# Patient Record
Sex: Male | Born: 1950 | Race: White | Hispanic: No | Marital: Married | Smoking: Current every day smoker
Health system: Southern US, Community
[De-identification: ages and names within clinical notes are randomized; demographics above are authoritative.]

## PROBLEM LIST (undated history)

## (undated) DIAGNOSIS — J42 Unspecified chronic bronchitis: Secondary | ICD-10-CM

## (undated) DIAGNOSIS — M1651 Unilateral post-traumatic osteoarthritis, right hip: Secondary | ICD-10-CM

## (undated) DIAGNOSIS — K219 Gastro-esophageal reflux disease without esophagitis: Secondary | ICD-10-CM

## (undated) DIAGNOSIS — I2510 Atherosclerotic heart disease of native coronary artery without angina pectoris: Secondary | ICD-10-CM

## (undated) DIAGNOSIS — M1990 Unspecified osteoarthritis, unspecified site: Secondary | ICD-10-CM

## (undated) DIAGNOSIS — E118 Type 2 diabetes mellitus with unspecified complications: Secondary | ICD-10-CM

## (undated) DIAGNOSIS — I70219 Atherosclerosis of native arteries of extremities with intermittent claudication, unspecified extremity: Secondary | ICD-10-CM

## (undated) DIAGNOSIS — I10 Essential (primary) hypertension: Secondary | ICD-10-CM

## (undated) DIAGNOSIS — I70229 Atherosclerosis of native arteries of extremities with rest pain, unspecified extremity: Secondary | ICD-10-CM

## (undated) DIAGNOSIS — I700 Atherosclerosis of aorta: Secondary | ICD-10-CM

## (undated) HISTORY — PX: HERNIA REPAIR: SHX51

## (undated) HISTORY — PX: MUSCLE REPAIR: SHX867

## (undated) HISTORY — PX: FRACTURE SURGERY: SHX138

---

## 2007-09-19 ENCOUNTER — Ambulatory Visit: Payer: Self-pay | Admitting: General Surgery

## 2008-03-11 ENCOUNTER — Ambulatory Visit: Payer: Self-pay | Admitting: Surgery

## 2008-03-26 ENCOUNTER — Ambulatory Visit: Payer: Self-pay | Admitting: Surgery

## 2008-04-02 ENCOUNTER — Ambulatory Visit: Payer: Self-pay | Admitting: Surgery

## 2008-10-06 ENCOUNTER — Ambulatory Visit: Payer: Self-pay | Admitting: Urology

## 2017-08-14 DIAGNOSIS — I700 Atherosclerosis of aorta: Secondary | ICD-10-CM | POA: Insufficient documentation

## 2017-08-22 ENCOUNTER — Telehealth: Payer: Self-pay | Admitting: *Deleted

## 2017-08-22 DIAGNOSIS — Z87891 Personal history of nicotine dependence: Secondary | ICD-10-CM

## 2017-08-22 DIAGNOSIS — Z122 Encounter for screening for malignant neoplasm of respiratory organs: Secondary | ICD-10-CM

## 2017-08-22 NOTE — Telephone Encounter (Signed)
Received referral for initial lung cancer screening scan. Contacted patient and obtained smoking history,(current, 41 pack year) as well as answering questions related to screening process. Patient denies signs of lung cancer such as weight loss or hemoptysis. Patient denies comorbidity that would prevent curative treatment if lung cancer were found. Patient is scheduled for shared decision making visit and CT scan on 09/19/17.

## 2017-08-29 ENCOUNTER — Encounter
Admission: RE | Admit: 2017-08-29 | Discharge: 2017-08-29 | Disposition: A | Discharge status: Home / Self Care | Payer: Medicare HMO | Source: Ambulatory Visit | Attending: Orthopedic Surgery | Admitting: Orthopedic Surgery

## 2017-08-29 ENCOUNTER — Other Ambulatory Visit: Payer: Self-pay

## 2017-08-29 DIAGNOSIS — Z01810 Encounter for preprocedural cardiovascular examination: Secondary | ICD-10-CM | POA: Diagnosis present

## 2017-08-29 DIAGNOSIS — Z01812 Encounter for preprocedural laboratory examination: Secondary | ICD-10-CM | POA: Diagnosis present

## 2017-08-29 HISTORY — DX: Gastro-esophageal reflux disease without esophagitis: K21.9

## 2017-08-29 HISTORY — DX: Unspecified osteoarthritis, unspecified site: M19.90

## 2017-08-29 LAB — CBC
HEMATOCRIT: 47.1 % (ref 40.0–52.0)
HEMOGLOBIN: 15.4 g/dL (ref 13.0–18.0)
MCH: 31.2 pg (ref 26.0–34.0)
MCHC: 32.7 g/dL (ref 32.0–36.0)
MCV: 95.4 fL (ref 80.0–100.0)
Platelets: 226 10*3/uL (ref 150–440)
RBC: 4.93 MIL/uL (ref 4.40–5.90)
RDW: 15.1 % — AB (ref 11.5–14.5)
WBC: 5.3 10*3/uL (ref 3.8–10.6)

## 2017-08-29 LAB — BASIC METABOLIC PANEL
Anion gap: 9 (ref 5–15)
BUN: 20 mg/dL (ref 6–20)
CO2: 26 mmol/L (ref 22–32)
Calcium: 9.1 mg/dL (ref 8.9–10.3)
Chloride: 101 mmol/L (ref 101–111)
Creatinine, Ser: 1.03 mg/dL (ref 0.61–1.24)
GFR calc Af Amer: >60 mL/min (ref >60)
Glucose, Bld: 110 mg/dL — ABNORMAL HIGH (ref 65–99)
POTASSIUM: 4.5 mmol/L (ref 3.5–5.1)
SODIUM: 136 mmol/L (ref 135–145)

## 2017-08-29 LAB — URINALYSIS, ROUTINE W REFLEX MICROSCOPIC
BACTERIA UA: NONE SEEN
Bilirubin Urine: NEGATIVE
Glucose, UA: NEGATIVE mg/dL
HGB URINE DIPSTICK: NEGATIVE
Ketones, ur: NEGATIVE mg/dL
Leukocytes, UA: NEGATIVE
Nitrite: NEGATIVE
Protein, ur: 30 mg/dL — AB
SPECIFIC GRAVITY, URINE: 1.011 (ref 1.005–1.030)
Squamous Epithelial / LPF: NONE SEEN
pH: 5 (ref 5.0–8.0)

## 2017-08-29 LAB — SURGICAL PCR SCREEN
MRSA, PCR: NEGATIVE
STAPHYLOCOCCUS AUREUS: POSITIVE — AB

## 2017-08-29 LAB — TYPE AND SCREEN
ABO/RH(D): O NEG
ANTIBODY SCREEN: NEGATIVE

## 2017-08-29 LAB — PROTIME-INR
INR: 0.88
PROTHROMBIN TIME: 11.9 s (ref 11.4–15.2)

## 2017-08-29 LAB — APTT: aPTT: 33 seconds (ref 24–36)

## 2017-08-29 NOTE — Patient Instructions (Signed)
Your procedure is scheduled on: Tue 09/04/17 Report to DAY SURGERY DEPARTMENT LOCATED ON 2ND FLOOR MEDICAL MALL ENTRANCE. To find out your arrival time please call [phone] between 1PM - 3PM on Mon 09/03/17.  Remember: Instructions that are not followed completely may result in serious medical risk, up to and including death, or upon the discretion of your surgeon and anesthesiologist your surgery may need to be rescheduled.     _X__ 1. Do not eat food after midnight the night before your procedure.                 No gum chewing or hard candies. You may drink clear liquids up to 2 hours                 before you are scheduled to arrive for your surgery- DO not drink clear                 liquids within 2 hours of the start of your surgery.                 Clear Liquids include:  water, apple juice without pulp, clear carbohydrate                 drink such as Clearfast or Gatorade, Black Coffee or Tea (Do not add                 anything to coffee or tea).  __X__2.  On the morning of surgery brush your teeth with toothpaste and water, you                 may rinse your mouth with mouthwash if you wish.  Do not swallow any              toothpaste of mouthwash.     _X__ 3.  No Alcohol for 24 hours before or after surgery.   _X__ 4.  Do Not Smoke or use e-cigarettes For 24 Hours Prior to Your Surgery.                 Do not use any chewable tobacco products for at least 6 hours prior to                 surgery.  ____  5.  Bring all medications with you on the day of surgery if instructed.   __X__  6.  Notify your doctor if there is any change in your medical condition      (cold, fever, infections).     Do not wear jewelry, make-up, hairpins, clips or nail polish. Do not wear lotions, powders, or perfumes.  Do not shave 48 hours prior to surgery. Men may shave face and neck. Do not bring valuables to the hospital.    [facility] is not responsible for any belongings or  valuables.  Contacts, dentures/partials or body piercings may not be worn into surgery. Bring a case for your contacts, glasses or hearing aids, a denture cup will be supplied. Leave your suitcase in the car. After surgery it may be brought to your room. For patients admitted to the hospital, discharge time is determined by your treatment team.   Patients discharged the day of surgery will not be allowed to drive home.   Please read over the following fact sheets that you were given:   MRSA Information  __X__ Take these medicines the morning of surgery with A SIP OF WATER:  1. none  2.   3.   4.  5.  6.  ____ Fleet Enema (as directed)   __X__ Use CHG Soap/SAGE wipes as directed  ____ Use inhalers on the day of surgery  ____ Stop metformin/Janumet/Farxiga 2 days prior to surgery    ____ Take 1/2 of usual insulin dose the night before surgery. No insulin the morning          of surgery.   ____ Stop Blood Thinners Coumadin/Plavix/Xarelto/Pleta/Pradaxa/Eliquis/Effient/Aspirin  on   Or contact your Surgeon, Cardiologist or Medical Doctor regarding  ability to stop your blood thinners  __X__ Stop Anti-inflammatories 7 days before surgery such as Advil, Ibuprofen, Motrin,  BC or Goodies Powder, Naprosyn, Naproxen, Aleve, Aspirin YOU MAY TAKE TYLENOL   __X__ Stop all herbal supplements, fish oil or vitamin E until after surgery.    ____ Bring C-Pap to the hospital.

## 2017-08-30 LAB — URINE CULTURE: CULTURE: NO GROWTH

## 2017-08-31 ENCOUNTER — Ambulatory Visit
Admission: RE | Admit: 2017-08-31 | Discharge: 2017-08-31 | Disposition: A | Discharge status: Home / Self Care | Payer: Medicare HMO | Source: Ambulatory Visit | Attending: Orthopedic Surgery | Admitting: Orthopedic Surgery

## 2017-08-31 DIAGNOSIS — Z01812 Encounter for preprocedural laboratory examination: Secondary | ICD-10-CM | POA: Diagnosis not present

## 2017-08-31 LAB — SEDIMENTATION RATE: Sed Rate: 4 mm/hr (ref 0–20)

## 2017-09-03 MED ORDER — CEFAZOLIN SODIUM-DEXTROSE 2-4 GM/100ML-% IV SOLN
2.0000 g | Freq: Once | INTRAVENOUS | Status: AC
  Administered 2017-09-04: 2 g via INTRAVENOUS

## 2017-09-04 ENCOUNTER — Other Ambulatory Visit: Payer: Self-pay

## 2017-09-04 ENCOUNTER — Encounter: Payer: Self-pay | Admitting: Anesthesiology

## 2017-09-04 ENCOUNTER — Inpatient Hospital Stay: Payer: Medicare HMO

## 2017-09-04 ENCOUNTER — Inpatient Hospital Stay: Payer: Medicare HMO | Admitting: Anesthesiology

## 2017-09-04 ENCOUNTER — Inpatient Hospital Stay
Admission: RE | Admit: 2017-09-04 | Discharge: 2017-09-07 | DRG: 470 | Disposition: A | Discharge status: Home Health | Payer: Medicare HMO | Source: Ambulatory Visit | Attending: Orthopedic Surgery | Admitting: Orthopedic Surgery

## 2017-09-04 ENCOUNTER — Encounter
Admission: RE | Disposition: A | Discharge status: Home Health | Payer: Self-pay | Source: Ambulatory Visit | Attending: Orthopedic Surgery

## 2017-09-04 DIAGNOSIS — G8918 Other acute postprocedural pain: Secondary | ICD-10-CM

## 2017-09-04 DIAGNOSIS — M1992 Post-traumatic osteoarthritis, unspecified site: Principal | ICD-10-CM | POA: Diagnosis present

## 2017-09-04 DIAGNOSIS — M1651 Unilateral post-traumatic osteoarthritis, right hip: Secondary | ICD-10-CM | POA: Diagnosis present

## 2017-09-04 DIAGNOSIS — Z419 Encounter for procedure for purposes other than remedying health state, unspecified: Secondary | ICD-10-CM

## 2017-09-04 DIAGNOSIS — K219 Gastro-esophageal reflux disease without esophagitis: Secondary | ICD-10-CM | POA: Diagnosis present

## 2017-09-04 HISTORY — PX: TOTAL HIP ARTHROPLASTY: SHX124

## 2017-09-04 LAB — CBC
HCT: 41 % (ref 40.0–52.0)
Hemoglobin: 13.6 g/dL (ref 13.0–18.0)
MCH: 31.2 pg (ref 26.0–34.0)
MCHC: 33.1 g/dL (ref 32.0–36.0)
MCV: 94.1 fL (ref 80.0–100.0)
PLATELETS: 192 10*3/uL (ref 150–440)
RBC: 4.35 MIL/uL — AB (ref 4.40–5.90)
RDW: 14.6 % — AB (ref 11.5–14.5)
WBC: 11.7 10*3/uL — AB (ref 3.8–10.6)

## 2017-09-04 LAB — CREATININE, SERUM
CREATININE: 1.16 mg/dL (ref 0.61–1.24)
GFR calc non Af Amer: >60 mL/min (ref >60)

## 2017-09-04 LAB — ABO/RH: ABO/RH(D): O NEG

## 2017-09-04 SURGERY — ARTHROPLASTY, HIP, TOTAL, ANTERIOR APPROACH
Anesthesia: Spinal | Site: Hip | Laterality: Right | Wound class: Clean

## 2017-09-04 MED ORDER — BISACODYL 10 MG RE SUPP: 10.0000 mg | Freq: Every day | RECTAL | Status: DC | PRN

## 2017-09-04 MED ORDER — TRANEXAMIC ACID 1000 MG/10ML IV SOLN
INTRAVENOUS | Status: DC | PRN
  Administered 2017-09-04: 1000 mg via INTRAVENOUS

## 2017-09-04 MED ORDER — MIDAZOLAM HCL 5 MG/5ML IJ SOLN
INTRAMUSCULAR | Status: DC | PRN
  Administered 2017-09-04: 2 mg via INTRAVENOUS

## 2017-09-04 MED ORDER — BUPIVACAINE-EPINEPHRINE (PF) 0.25% -1:200000 IJ SOLN
INTRAMUSCULAR | Status: AC
  Filled 2017-09-04: qty 30

## 2017-09-04 MED ORDER — BUPIVACAINE HCL (PF) 0.5 % IJ SOLN
INTRAMUSCULAR | Status: AC
  Filled 2017-09-04: qty 10

## 2017-09-04 MED ORDER — HYDROMORPHONE HCL 1 MG/ML IJ SOLN: 0.5000 mg | INTRAMUSCULAR | Status: DC | PRN

## 2017-09-04 MED ORDER — ZOLPIDEM TARTRATE 5 MG PO TABS
5.0000 mg | ORAL_TABLET | Freq: Every evening | ORAL | Status: DC | PRN
  Administered 2017-09-06: 5 mg via ORAL
  Filled 2017-09-04 (×2): qty 1

## 2017-09-04 MED ORDER — TRAMADOL HCL 50 MG PO TABS
50.0000 mg | ORAL_TABLET | Freq: Four times a day (QID) | ORAL | Status: DC
  Administered 2017-09-04 – 2017-09-07 (×10): 50 mg via ORAL
  Filled 2017-09-04 (×10): qty 1

## 2017-09-04 MED ORDER — ALUM & MAG HYDROXIDE-SIMETH 200-200-20 MG/5ML PO SUSP
30.0000 mL | ORAL | Status: DC | PRN
  Administered 2017-09-06: 30 mL via ORAL
  Filled 2017-09-04: qty 30

## 2017-09-04 MED ORDER — NEOMYCIN-POLYMYXIN B GU 40-200000 IR SOLN
Status: AC
  Filled 2017-09-04: qty 4

## 2017-09-04 MED ORDER — ONDANSETRON HCL 4 MG PO TABS: 4.0000 mg | ORAL_TABLET | Freq: Four times a day (QID) | ORAL | Status: DC | PRN

## 2017-09-04 MED ORDER — OXYCODONE HCL 5 MG PO TABS
10.0000 mg | ORAL_TABLET | ORAL | Status: DC | PRN
  Administered 2017-09-05 – 2017-09-06 (×3): 15 mg via ORAL
  Administered 2017-09-06: 10 mg via ORAL
  Filled 2017-09-04 (×4): qty 3

## 2017-09-04 MED ORDER — SODIUM CHLORIDE 0.9 % IV SOLN
INTRAVENOUS | Status: DC
  Administered 2017-09-04 – 2017-09-05 (×2): via INTRAVENOUS

## 2017-09-04 MED ORDER — TRANEXAMIC ACID 1000 MG/10ML IV SOLN
INTRAVENOUS | Status: AC
  Filled 2017-09-04: qty 10

## 2017-09-04 MED ORDER — DOCUSATE SODIUM 100 MG PO CAPS
100.0000 mg | ORAL_CAPSULE | Freq: Two times a day (BID) | ORAL | Status: DC
  Administered 2017-09-04 – 2017-09-07 (×6): 100 mg via ORAL
  Filled 2017-09-04 (×6): qty 1

## 2017-09-04 MED ORDER — PROPOFOL 500 MG/50ML IV EMUL
INTRAVENOUS | Status: DC | PRN
  Administered 2017-09-04: 75 ug/kg/min via INTRAVENOUS

## 2017-09-04 MED ORDER — ACETAMINOPHEN 10 MG/ML IV SOLN
INTRAVENOUS | Status: DC | PRN
  Administered 2017-09-04: 1000 mg via INTRAVENOUS

## 2017-09-04 MED ORDER — LACTATED RINGERS IV SOLN
INTRAVENOUS | Status: DC
  Administered 2017-09-04: 15:00:00 via INTRAVENOUS

## 2017-09-04 MED ORDER — PROPOFOL 10 MG/ML IV BOLUS
INTRAVENOUS | Status: DC | PRN
  Administered 2017-09-04: 40 mg via INTRAVENOUS

## 2017-09-04 MED ORDER — ACETAMINOPHEN 10 MG/ML IV SOLN
INTRAVENOUS | Status: AC
  Filled 2017-09-04: qty 100

## 2017-09-04 MED ORDER — PHENOL 1.4 % MT LIQD
1.0000 | OROMUCOSAL | Status: DC | PRN
  Filled 2017-09-04: qty 177

## 2017-09-04 MED ORDER — METHOCARBAMOL 500 MG PO TABS
500.0000 mg | ORAL_TABLET | Freq: Four times a day (QID) | ORAL | Status: DC | PRN
  Administered 2017-09-05: 500 mg via ORAL
  Filled 2017-09-04: qty 1

## 2017-09-04 MED ORDER — MIDAZOLAM HCL 2 MG/2ML IJ SOLN
INTRAMUSCULAR | Status: AC
  Filled 2017-09-04: qty 2

## 2017-09-04 MED ORDER — CEFAZOLIN SODIUM-DEXTROSE 2-4 GM/100ML-% IV SOLN
INTRAVENOUS | Status: AC
  Filled 2017-09-04: qty 100

## 2017-09-04 MED ORDER — ONDANSETRON HCL 4 MG/2ML IJ SOLN: 4.0000 mg | Freq: Once | INTRAMUSCULAR | Status: DC | PRN

## 2017-09-04 MED ORDER — ONDANSETRON HCL 4 MG/2ML IJ SOLN
INTRAMUSCULAR | Status: DC | PRN
  Administered 2017-09-04: 4 mg via INTRAVENOUS

## 2017-09-04 MED ORDER — PROPOFOL 500 MG/50ML IV EMUL
INTRAVENOUS | Status: AC
  Filled 2017-09-04: qty 50

## 2017-09-04 MED ORDER — BUPIVACAINE-EPINEPHRINE 0.25% -1:200000 IJ SOLN
INTRAMUSCULAR | Status: DC | PRN
  Administered 2017-09-04: 30 mL

## 2017-09-04 MED ORDER — NEOMYCIN-POLYMYXIN B GU 40-200000 IR SOLN
Status: DC | PRN
  Administered 2017-09-04: 4 mL

## 2017-09-04 MED ORDER — ENOXAPARIN SODIUM 40 MG/0.4ML ~~LOC~~ SOLN
40.0000 mg | SUBCUTANEOUS | Status: DC
  Administered 2017-09-05 – 2017-09-07 (×3): 40 mg via SUBCUTANEOUS
  Filled 2017-09-04 (×3): qty 0.4

## 2017-09-04 MED ORDER — METHOCARBAMOL 1000 MG/10ML IJ SOLN
500.0000 mg | Freq: Four times a day (QID) | INTRAVENOUS | Status: DC | PRN
  Filled 2017-09-04: qty 5

## 2017-09-04 MED ORDER — METOCLOPRAMIDE HCL 10 MG PO TABS: 5.0000 mg | ORAL_TABLET | Freq: Three times a day (TID) | ORAL | Status: DC | PRN

## 2017-09-04 MED ORDER — SODIUM CHLORIDE 0.9 % IV SOLN
INTRAVENOUS | Status: DC | PRN
  Administered 2017-09-04: 50 ug/min via INTRAVENOUS

## 2017-09-04 MED ORDER — ONDANSETRON HCL 4 MG/2ML IJ SOLN: 4.0000 mg | Freq: Four times a day (QID) | INTRAMUSCULAR | Status: DC | PRN

## 2017-09-04 MED ORDER — MAGNESIUM CITRATE PO SOLN
1.0000 | Freq: Once | ORAL | Status: AC | PRN
  Administered 2017-09-06: 1 via ORAL
  Filled 2017-09-04 (×4): qty 296

## 2017-09-04 MED ORDER — GABAPENTIN 300 MG PO CAPS
300.0000 mg | ORAL_CAPSULE | Freq: Three times a day (TID) | ORAL | Status: DC
  Administered 2017-09-04 – 2017-09-07 (×8): 300 mg via ORAL
  Filled 2017-09-04 (×8): qty 1

## 2017-09-04 MED ORDER — METOCLOPRAMIDE HCL 5 MG/ML IJ SOLN: 5.0000 mg | Freq: Three times a day (TID) | INTRAMUSCULAR | Status: DC | PRN

## 2017-09-04 MED ORDER — ACETAMINOPHEN 500 MG PO TABS
1000.0000 mg | ORAL_TABLET | Freq: Four times a day (QID) | ORAL | Status: AC
  Administered 2017-09-04 – 2017-09-05 (×3): 1000 mg via ORAL
  Filled 2017-09-04 (×3): qty 2

## 2017-09-04 MED ORDER — FENTANYL CITRATE (PF) 100 MCG/2ML IJ SOLN: 25.0000 ug | INTRAMUSCULAR | Status: DC | PRN

## 2017-09-04 MED ORDER — FAMOTIDINE 20 MG PO TABS
20.0000 mg | ORAL_TABLET | Freq: Once | ORAL | Status: AC
  Administered 2017-09-04: 20 mg via ORAL

## 2017-09-04 MED ORDER — MENTHOL 3 MG MT LOZG
1.0000 | LOZENGE | OROMUCOSAL | Status: DC | PRN
  Filled 2017-09-04: qty 9

## 2017-09-04 MED ORDER — ACETAMINOPHEN 325 MG PO TABS
325.0000 mg | ORAL_TABLET | Freq: Four times a day (QID) | ORAL | Status: DC | PRN
  Administered 2017-09-06: 650 mg via ORAL
  Filled 2017-09-04: qty 2

## 2017-09-04 MED ORDER — CEFAZOLIN SODIUM-DEXTROSE 2-4 GM/100ML-% IV SOLN
2.0000 g | Freq: Four times a day (QID) | INTRAVENOUS | Status: AC
  Administered 2017-09-04 – 2017-09-05 (×3): 2 g via INTRAVENOUS
  Filled 2017-09-04 (×3): qty 100

## 2017-09-04 MED ORDER — DIPHENHYDRAMINE HCL 12.5 MG/5ML PO ELIX: 12.5000 mg | ORAL_SOLUTION | ORAL | Status: DC | PRN

## 2017-09-04 MED ORDER — OXYCODONE HCL 5 MG PO TABS
5.0000 mg | ORAL_TABLET | ORAL | Status: DC | PRN
  Administered 2017-09-05: 10 mg via ORAL
  Administered 2017-09-05: 5 mg via ORAL
  Administered 2017-09-05 – 2017-09-07 (×6): 10 mg via ORAL
  Filled 2017-09-04 (×3): qty 2
  Filled 2017-09-04: qty 1
  Filled 2017-09-04 (×5): qty 2

## 2017-09-04 MED ORDER — FAMOTIDINE 20 MG PO TABS
ORAL_TABLET | ORAL | Status: AC
  Filled 2017-09-04: qty 1

## 2017-09-04 MED ORDER — SENNOSIDES-DOCUSATE SODIUM 8.6-50 MG PO TABS: 1.0000 | ORAL_TABLET | Freq: Every evening | ORAL | Status: DC | PRN

## 2017-09-04 MED ORDER — BUPIVACAINE HCL (PF) 0.5 % IJ SOLN
INTRAMUSCULAR | Status: DC | PRN
  Administered 2017-09-04: 3 mL

## 2017-09-04 SURGICAL SUPPLY — 57 items
BLADE SAW SAG 18.5X105 (BLADE) ×3 IMPLANT
BNDG COHESIVE 6X5 TAN STRL LF (GAUZE/BANDAGES/DRESSINGS) ×9 IMPLANT
CANISTER SUCT 1200ML W/VALVE (MISCELLANEOUS) ×3 IMPLANT
CHLORAPREP W/TINT 26ML (MISCELLANEOUS) ×3 IMPLANT
DRAPE C-ARM XRAY 36X54 (DRAPES) ×3 IMPLANT
DRAPE INCISE IOBAN 66X60 STRL (DRAPES) IMPLANT
DRAPE POUCH INSTRU U-SHP 10X18 (DRAPES) ×3 IMPLANT
DRAPE SHEET LG 3/4 BI-LAMINATE (DRAPES) ×9 IMPLANT
DRAPE TABLE BACK 80X90 (DRAPES) ×3 IMPLANT
DRESSING SURGICEL FIBRLLR 1X2 (HEMOSTASIS) ×2 IMPLANT
DRSG OPSITE POSTOP 4X10 (GAUZE/BANDAGES/DRESSINGS) ×3 IMPLANT
DRSG OPSITE POSTOP 4X8 (GAUZE/BANDAGES/DRESSINGS) ×6 IMPLANT
DRSG SURGICEL FIBRILLAR 1X2 (HEMOSTASIS) ×6
ELECT BLADE 6.5 EXT (BLADE) ×3 IMPLANT
ELECT REM PT RETURN 9FT ADLT (ELECTROSURGICAL) ×3
ELECTRODE REM PT RTRN 9FT ADLT (ELECTROSURGICAL) ×1 IMPLANT
EVACUATOR 1/8 PVC DRAIN (DRAIN) IMPLANT
GLOVE BIOGEL PI IND STRL 9 (GLOVE) ×1 IMPLANT
GLOVE BIOGEL PI INDICATOR 9 (GLOVE) ×2
GLOVE SURG SYN 9.0  PF PI (GLOVE) ×4
GLOVE SURG SYN 9.0 PF PI (GLOVE) ×2 IMPLANT
GOWN SRG 2XL LVL 4 RGLN SLV (GOWNS) ×1 IMPLANT
GOWN STRL NON-REIN 2XL LVL4 (GOWNS) ×2
GOWN STRL REUS W/ TWL LRG LVL3 (GOWN DISPOSABLE) ×1 IMPLANT
GOWN STRL REUS W/TWL LRG LVL3 (GOWN DISPOSABLE) ×2
HIP FEM HD XL 28 (Head) ×3 IMPLANT
HOLDER FOLEY CATH W/STRAP (MISCELLANEOUS) ×3 IMPLANT
HOOD PEEL AWAY FLYTE STAYCOOL (MISCELLANEOUS) ×3 IMPLANT
KIT PREVENA INCISION MGT 13 (CANNISTER) ×3 IMPLANT
LINER DM 28MM (Liner) ×3 IMPLANT
LINER DM SZH 28X56 (Liner) ×1 IMPLANT
MAT BLUE FLOOR 46X72 FLO (MISCELLANEOUS) ×3 IMPLANT
NDL SAFETY ECLIPSE 18X1.5 (NEEDLE) ×1 IMPLANT
NEEDLE HYPO 18GX1.5 SHARP (NEEDLE) ×2
NEEDLE SPNL 18GX3.5 QUINCKE PK (NEEDLE) ×3 IMPLANT
NS IRRIG 1000ML POUR BTL (IV SOLUTION) ×3 IMPLANT
PACK HIP COMPR (MISCELLANEOUS) ×3 IMPLANT
SHELL ACETABULAR DM  56MM (Shell) ×3 IMPLANT
SOL PREP PVP 2OZ (MISCELLANEOUS) ×3
SOLUTION PREP PVP 2OZ (MISCELLANEOUS) ×1 IMPLANT
SPONGE DRAIN TRACH 4X4 STRL 2S (GAUZE/BANDAGES/DRESSINGS) ×3 IMPLANT
STAPLER SKIN PROX 35W (STAPLE) ×3 IMPLANT
STEM FEMORAL 4 STD COLLARED (Stem) ×3 IMPLANT
STRAP SAFETY 5IN WIDE (MISCELLANEOUS) ×3 IMPLANT
SUT DVC 2 QUILL PDO  T11 36X36 (SUTURE) ×2
SUT DVC 2 QUILL PDO T11 36X36 (SUTURE) ×1 IMPLANT
SUT SILK 0 (SUTURE) ×2
SUT SILK 0 30XBRD TIE 6 (SUTURE) ×1 IMPLANT
SUT V-LOC 90 ABS DVC 3-0 CL (SUTURE) ×3 IMPLANT
SUT VIC AB 1 CT1 36 (SUTURE) ×3 IMPLANT
SYR 20CC LL (SYRINGE) ×3 IMPLANT
SYR 30ML LL (SYRINGE) ×3 IMPLANT
SYR BULB IRRIG 60ML STRL (SYRINGE) ×3 IMPLANT
TAPE MICROFOAM 4IN (TAPE) ×3 IMPLANT
TOWEL OR 17X26 4PK STRL BLUE (TOWEL DISPOSABLE) ×3 IMPLANT
TRAY FOLEY W/METER SILVER 16FR (SET/KITS/TRAYS/PACK) ×3 IMPLANT
WND VAC CANISTER 500ML (MISCELLANEOUS) IMPLANT

## 2017-09-04 NOTE — Anesthesia Post-op Follow-up Note (Signed)
Anesthesia QCDR form completed.        

## 2017-09-04 NOTE — Anesthesia Procedure Notes (Signed)
Spinal  Patient location during procedure: OR Start time: 09/04/2017 4:55 PM End time: 09/04/2017 4:58 PM Staffing Other anesthesia staff: Laruth BouchardGolob, Jamie C, RN Performed: other anesthesia staff  Preanesthetic Checklist Completed: patient identified, site marked, surgical consent, pre-op evaluation, timeout performed, IV checked, risks and benefits discussed and monitors and equipment checked Spinal Block Patient position: sitting Prep: ChloraPrep Patient monitoring: heart rate Approach: midline Location: L3-4 Injection technique: single-shot Needle Needle type: Pencan  Needle length: 10 cm Assessment Sensory level: T4

## 2017-09-04 NOTE — Anesthesia Preprocedure Evaluation (Addendum)
Anesthesia Evaluation  Patient identified by MRN, date of birth, ID band Patient awake    Reviewed: Allergy & Precautions, NPO status , Patient's Chart, lab work & pertinent test results, reviewed documented beta blocker date and time   Airway Mallampati: III  TM Distance: >3 FB     Dental  (+) Chipped, Upper Dentures, Lower Dentures   Pulmonary Current Smoker,           Cardiovascular      Neuro/Psych    GI/Hepatic GERD  Controlled,  Endo/Other    Renal/GU      Musculoskeletal  (+) Arthritis ,   Abdominal   Peds  Hematology   Anesthesia Other Findings Obese. Rbbb.  Reproductive/Obstetrics                            Anesthesia Physical Anesthesia Plan  ASA: III  Anesthesia Plan: Spinal   Post-op Pain Management:    Induction:   PONV Risk Score and Plan:   Airway Management Planned: Nasal Cannula  Additional Equipment:   Intra-op Plan:   Post-operative Plan:   Informed Consent: I have reviewed the patients History and Physical, chart, labs and discussed the procedure including the risks, benefits and alternatives for the proposed anesthesia with the patient or authorized representative who has indicated his/her understanding and acceptance.     Plan Discussed with: CRNA  Anesthesia Plan Comments:        Anesthesia Quick Evaluation

## 2017-09-04 NOTE — Op Note (Signed)
09/04/2017  6:27 PM  PATIENT:  Phillip CulverKeith Mendez  67 y.o. male  PRE-OPERATIVE DIAGNOSIS:  post traumatic osteoarthritis of right hip  POST-OPERATIVE DIAGNOSIS:  post traumatic osteoarthritis of right hip  PROCEDURE:  Procedure(s): TOTAL HIP ARTHROPLASTY ANTERIOR APPROACH (Right)  SURGEON: Leitha SchullerMichael J Akiba Melfi, MD  ASSISTANTS: none  ANESTHESIA:   spinal  EBL:  Total I/O In: 600 [I.V.:600] Out: 650 [Urine:400; Blood:250]  BLOOD ADMINISTERED:none  DRAINS: none   LOCAL MEDICATIONS USED:  MARCAINE     SPECIMEN:  Source of Specimen:  Right femoral head  DISPOSITION OF SPECIMEN:  PATHOLOGY  COUNTS:  YES  TOURNIQUET:  * No tourniquets in log *  IMPLANTS: Medacta 4 standard AMIS, 56 mm Mpact DM cup and liner, XL 28 mm metal head  DICTATION: .Dragon Dictation   The patient was brought to the operating room and after spinal anesthesia was obtained patient was placed on the operative table with the ipsilateral foot into the Medacta attachment, contralateral leg on a well-padded table. C-arm was brought in and preop template x-ray taken. After prepping and draping in usual sterile fashion appropriate patient identification and timeout procedures were completed. Anterior approach to the hip was obtained and centered over the greater trochanter and TFL muscle. The subcutaneous tissue was incised hemostasis being achieved by electrocautery. TFL fascia was incised and the muscle retracted laterally deep retractor placed. The lateral femoral circumflex vessels were identified and ligated. The anterior capsule was exposed and a capsulotomy performed. The neck was identified and a femoral neck cut carried out with a saw. The head was removed without difficulty and showed sclerotic femoral head and acetabulum. Reaming was carried out to 54 mm and a 56 mm cup trial gave appropriate tightness to the acetabular component a 56 DM cup was impacted into position. The leg was then externally rotated and  ischiofemoral and pubofemoral releases carried out. The femur was sequentially broached to a size 4, size 4 standard with XL head trials were placed and the final components chosen. The 4 standard stem was inserted along with a metal XL 28 mm head and 56 mm liner. The hip was reduced and was stable the wound was thoroughly irrigated with fibrillar placed along the posterior capsule and medial neck. The deep fascia was closed using a heavy Quill after infiltration of 30 cc of quarter percent Sensorcaine with epinephrine.  Skin closed with 3-0 v-loc subcuticular and skin staples followed by Xeroform and honeycomb dressing  PLAN OF CARE: Admit to inpatient

## 2017-09-04 NOTE — Transfer of Care (Signed)
Immediate Anesthesia Transfer of Care Note  Patient: Phillip Mendez  Procedure(s) Performed: TOTAL HIP ARTHROPLASTY ANTERIOR APPROACH (Right Hip)  Patient Location: PACU  Anesthesia Type:Spinal  Level of Consciousness: awake and drowsy  Airway & Oxygen Therapy: Patient Spontanous Breathing and Patient connected to face mask oxygen  Post-op Assessment: Report given to RN and Post -op Vital signs reviewed and stable  Post vital signs: Reviewed and stable  Last Vitals:  Vitals Value Taken Time  BP 101/58 09/04/2017  6:27 PM  Temp 36.8 C 09/04/2017  6:27 PM  Pulse 58 09/04/2017  6:27 PM  Resp 16 09/04/2017  6:27 PM  SpO2 100 % 09/04/2017  6:27 PM  Vitals shown include unvalidated device data.  Last Pain:  Vitals:   09/04/17 1827  TempSrc: Tympanic  PainSc:          Complications: No apparent anesthesia complications

## 2017-09-04 NOTE — H&P (Signed)
Reviewed paper H+P, will be scanned into chart. No changes noted.  

## 2017-09-05 ENCOUNTER — Encounter: Payer: Self-pay | Admitting: Orthopedic Surgery

## 2017-09-05 NOTE — Care Management Note (Signed)
Case Management Note  Patient Details  Name: Phillip Mendez MRN: [id number] Date of Birth: 07/05/1950  Subjective/Objective:  POD # 1 right total hip arthroplasty.  Met with patient to discuss discharge planning. He lives at home with his wife and daughter. He will need a walker, declined bsc. Ordered from [place] with [facility]. Offered choice of home health agencies. Patient prefers [facility]. Referral to [facility] with [facility] for HHPT. [facility]acy: [facility] [address] [phone]. Called Lovenox 40 mg # 14 no refills.                  Action/Plan: [facility] for HHPT and walker. Lovenox called in.   Expected Discharge Date:  09/06/17               Expected Discharge Plan:  [place]  In-House Referral:     Discharge planning Services  CM Consult  Post Acute Care Choice:  Durable Medical Equipment, Home Health Choice offered to:  Patient  DME Arranged:  Walker rolling DME Agency:  [place]:  PT [facility] Agency:  [place]  Status of Service:  In process, will continue to follow  If discussed at Long Length of Stay Meetings, dates discussed:    Additional Comments:  Jolly Mango, RN 09/05/2017, 11:05 AM

## 2017-09-05 NOTE — Progress Notes (Signed)
Clinical Social Worker (CSW) received SNF consult. PT is recommending home health. RN case manager aware of above. Please reconsult if future social work needs arise. CSW signing off.   Adylee Leonardo, LCSW [phone] 

## 2017-09-05 NOTE — Evaluation (Signed)
Physical Therapy Evaluation Patient Details Name: Phillip Mendez MRN: [id number] DOB: Sep 18, 1950 Today's Date: 09/05/2017   History of Present Illness  Pt admitted from home s/p R THA anterior approach.  PMH includes GERD and arthritis.  Clinical Impression  Patient is a 67 year old male who lives in a one story home with his wife.  He is very active at baseline and uses a SPC PRN when he has "worked too much".  Pt is alert and oriented to self, time and place and does demonstrate some impulsivity with movement when receiving instructions.  Pt presents with WNL strength of bilateral UE and L LE, as well as R LE not affected by procedure.  He reports 3/10 pain throughout majority of evaluation.  Pt required min A for moving R LE over EOB when performing supine to sit.  Pt able to balance at EOB for 3-4 min without assist.  Pt able to perform STS CGA with VC's for safe management of RW and education concerning wt shift and use of RW for management of WB.  He was able to ambulate 50 ft in room CGA with VC's for sequencing during turns and during stand to sit.  PT introduced LE exercises and advised pt concerning frequency and importance of there ex.  Pt was agreeable.  Pt will continue to benefit from skilled PT with focus on strength, ROM, tolerance to activity, functional mobility, pain management and safe use of AD.    Follow Up Recommendations Home health PT    Equipment Recommendations  Rolling walker with 5" wheels    Recommendations for Other Services       Precautions / Restrictions Precautions Precautions: Anterior Hip Restrictions Weight Bearing Restrictions: Yes RLE Weight Bearing: Weight bearing as tolerated      Mobility  Bed Mobility Overal bed mobility: Needs Assistance Bed Mobility: Supine to Sit     Supine to sit: Min assist     General bed mobility comments: PT provided assistance in bringing R LE over EOB and VC's for sequencing for best body  mechanics.  Transfers Overall transfer level: Needs assistance Equipment used: Rolling walker (2 wheeled) Transfers: Sit to/from Stand Sit to Stand: Min guard         General transfer comment: PT provided education concerning safe use of RW which needs to be reinforced.  Pt was able to stand without physical assist.  Ambulation/Gait Ambulation/Gait assistance: Min guard Ambulation Distance (Feet): 50 Feet Assistive device: Rolling walker (2 wheeled)     Gait velocity interpretation: Below normal speed for age/gender General Gait Details: Pt able to ambulate in to bathroom with RW and VC's from PT for sequencing during turns and during stand to sit.  Pt presented with L wt shift and decreased stance time on R LE, slight out turn of R LE and moderate foot clearance bilaterally during swing phase.  Stairs            Wheelchair Mobility    Modified Rankin (Stroke Patients Only)       Balance Overall balance assessment: Modified Independent                                           Pertinent Vitals/Pain Pain Assessment: 0-10 Pain Score: 3  Pain Location: R hip with WB    Home Living Family/patient expects to be discharged to:: Private residence Living Arrangements: Spouse/significant  other Available Help at Discharge: Family Type of Home: House Home Access: Stairs to enter Entrance Stairs-Rails: Can reach both Entrance Stairs-Number of Steps: 2 Home Layout: One level Home Equipment: Cane - single point      Prior Function Level of Independence: Independent with assistive device(s)         Comments: Uses SPC on occasion when he "works too much" as he sits down and rests.     Hand Dominance   Dominant Hand: Right    Extremity/Trunk Assessment   Upper Extremity Assessment Upper Extremity Assessment: Overall WFL for tasks assessed    Lower Extremity Assessment Lower Extremity Assessment: Overall WFL for tasks assessed;RLE  deficits/detail RLE: Unable to fully assess due to pain RLE Sensation: WNL RLE Coordination: WNL    Cervical / Trunk Assessment Cervical / Trunk Assessment: Normal  Communication   Communication: No difficulties  Cognition Arousal/Alertness: Awake/alert Behavior During Therapy: WFL for tasks assessed/performed Overall Cognitive Status: Within Functional Limits for tasks assessed                                        General Comments      Exercises Total Joint Exercises Ankle Circles/Pumps: AROM;20 reps;Seated Quad Sets: Strengthening;Right;10 reps;Seated Short Arc Quad: Right;5 reps;Strengthening;Seated Hip ABduction/ADduction: Strengthening;5 reps;Seated   Assessment/Plan    PT Assessment Patient needs continued PT services  PT Problem List Decreased strength;Decreased mobility;Decreased range of motion;Decreased activity tolerance;Decreased balance;Decreased knowledge of use of DME;Pain;Decreased knowledge of precautions       PT Treatment Interventions DME instruction;Therapeutic activities;Gait training;Therapeutic exercise;Stair training;Balance training;Functional mobility training;Neuromuscular re-education;Patient/family education    PT Goals (Current goals can be found in the Care Plan section)  Acute Rehab PT Goals Patient Stated Goal: To return home and back to active lifestyle as soon as possible. PT Goal Formulation: With patient Time For Goal Achievement: 09/19/17 Potential to Achieve Goals: Good    Frequency BID   Barriers to discharge        Co-evaluation               AM-PAC PT "6 Clicks" Daily Activity  Outcome Measure Difficulty turning over in bed (including adjusting bedclothes, sheets and blankets)?: A Little Difficulty moving from lying on back to sitting on the side of the bed? : A Little Difficulty sitting down on and standing up from a chair with arms (e.g., wheelchair, bedside commode, etc,.)?: A Little Help  needed moving to and from a bed to chair (including a wheelchair)?: A Little Help needed walking in hospital room?: A Little Help needed climbing 3-5 steps with a railing? : A Little 6 Click Score: 18    End of Session Equipment Utilized During Treatment: Gait belt Activity Tolerance: Patient tolerated treatment well Patient left: in chair;with chair alarm set;with call bell/phone within reach Nurse Communication: Mobility status PT Visit Diagnosis: Unsteadiness on feet (R26.81);Muscle weakness (generalized) (M62.81);Pain Pain - Right/Left: Right Pain - part of body: Hip    Time: 0930-1000 PT Time Calculation (min) (ACUTE ONLY): 30 min   Charges:   PT Evaluation $PT Eval Low Complexity: 1 Low PT Treatments $Therapeutic Activity: 8-22 mins   PT G Codes:   PT G-Codes **NOT FOR INPATIENT CLASS** Functional Assessment Tool Used: AM-PAC 6 Clicks Basic Mobility    Glenetta Hew, PT, DPT   Glenetta Hew 09/05/2017, 10:10 AM

## 2017-09-05 NOTE — Evaluation (Signed)
Occupational Therapy Evaluation Patient Details Name: Phillip Mendez MRN: [id number] DOB: 03-06-1951 Today's Date: 09/05/2017    History of Present Illness Pt is a  67 y.o. male who [facility] approach Right THA. Pt. PMH includes GERD and arthritis.   Clinical Impression   Pt. Presents with weakness, and limited functional mobility which limits the ability to complete basic ADL and IADL functioning. Pt. Resides at home with his wife, and daughter. Pt. was independent with ADLs, and IADL functioning: including: meal preparation, and medication management. 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 Pt. education was provided about A/E use for LE ADLs. Pt. reports having a reacher, but would like a LH shoehorn. Pt. walked to the bathroom toilet with CGA, stood to perform toileting with modI, and supervision for standing grooming tasks at the sinkside.Pt. could benefit from OT services for ADL training, A/E training, and pt. education about home modification, and DME. Pt. plans to return home with family upon discharge. No further OT services are warranted at this time.    Follow Up Recommendations  No OT follow up    Equipment Recommendations       Recommendations for Other Services       Precautions / Restrictions Precautions Precautions: Anterior Hip Restrictions Weight Bearing Restrictions: Yes RLE Weight Bearing: Weight bearing as tolerated      Mobility Bed Mobility Overal bed mobility: Needs Assistance Bed Mobility: Supine to Sit     Supine to sit: Min assist     General bed mobility comments: PT provided assistance in bringing R LE over EOB and VC's for sequencing for best body mechanics.  Transfers Overall transfer level: Needs assistance Equipment used: Rolling walker (2 wheeled) Transfers: Sit to/from Stand Sit to Stand: Min guard         General transfer comment: PT provided education concerning safe use of RW which  needs to be reinforced.  Pt was able to stand without physical assist.    Balance Overall balance assessment: Modified Independent                                         ADL either performed or assessed with clinical judgement   ADL Overall ADL's : Needs assistance/impaired Eating/Feeding: Independent;Set up   Grooming: Standing;Set up;Supervision/safety               Lower Body Dressing: Set up;Minimal assistance   Toilet Transfer: Min guard;Ambulation;Regular Social workerToilet   Toileting- Clothing Manipulation and Hygiene: Independent       Functional mobility during ADLs: Modified independent       Vision         Perception     Praxis      Pertinent Vitals/Pain Pain Assessment: 0-10 Pain Score: 3  Pain Location: Right Hip Pain Descriptors / Indicators: Aching Pain Intervention(s): Limited activity within patient's tolerance;Monitored during session;Repositioned;Patient requesting pain meds-RN notified     Hand Dominance Right   Extremity/Trunk Assessment Upper Extremity Assessment Upper Extremity Assessment: Overall WFL for tasks assessed       Communication Communication Communication: No difficulties   Cognition Arousal/Alertness: Awake/alert Behavior During Therapy: WFL for tasks assessed/performed Overall Cognitive Status: Within Functional Limits for tasks assessed  General Comments       Exercises   Shoulder Instructions      Home Living Family/patient expects to be discharged to:: Private residence Living Arrangements: Spouse/significant other Available Help at Discharge: Family Type of Home: House Home Access: Stairs to enter Secretary/administrator of Steps: 2 Entrance Stairs-Rails: Can reach both Home Layout: One level     Bathroom Shower/Tub: Producer, television/film/video: Handicapped height Bathroom Accessibility: Yes   Home Equipment: Cane - single point           Prior Functioning/Environment Level of Independence: Independent with assistive device(s)        Comments: Independent with ADLs, IADLS, and working in his own business.        OT Problem List: Decreased strength;Decreased activity tolerance;Decreased knowledge of use of DME or AE;Decreased range of motion;Pain      OT Treatment/Interventions:      OT Goals(Current goals can be found in the care plan section) Acute Rehab OT Goals Patient Stated Goal: To return home OT Goal Formulation: With patient Potential to Achieve Goals: Good  OT Frequency:     Barriers to D/C:            Co-evaluation              AM-PAC PT "6 Clicks" Daily Activity     Outcome Measure Help from another person eating meals?: None Help from another person taking care of personal grooming?: A Little Help from another person toileting, which includes using toliet, bedpan, or urinal?: A Little Help from another person bathing (including washing, rinsing, drying)?: A Little Help from another person to put on and taking off regular upper body clothing?: None Help from another person to put on and taking off regular lower body clothing?: A Little 6 Click Score: 20   End of Session Equipment Utilized During Treatment: Gait belt  Activity Tolerance: Patient tolerated treatment well Patient left:                     Time: 1025-1050 OT Time Calculation (min): 25 min Charges:  OT General Charges $OT Visit: 1 Visit OT Evaluation $OT Eval Moderate Complexity: 1 Mod G-Codes:     Olegario Messier, MS, OTR/L   Olegario Messier, MS, OTR/L 09/05/2017, 1:23 PM

## 2017-09-05 NOTE — Anesthesia Postprocedure Evaluation (Signed)
Anesthesia Post Note  Patient: Phillip Mendez  Procedure(s) Performed: TOTAL HIP ARTHROPLASTY ANTERIOR APPROACH (Right Hip)  Patient location during evaluation: Nursing Unit Anesthesia Type: Spinal Level of consciousness: awake, awake and alert, oriented and patient cooperative Pain management: pain level controlled Respiratory status: spontaneous breathing, nonlabored ventilation and respiratory function stable Cardiovascular status: stable Postop Assessment: no headache, no backache, no apparent nausea or vomiting, adequate PO intake and patient able to bend at knees Anesthetic complications: no     Last Vitals:  Vitals:   09/05/17 0512 09/05/17 0741  BP: 125/80 139/86  Pulse: 60 67  Resp: 19 18  Temp: 36.8 C 36.8 C  SpO2: 96% 98%    Last Pain:  Vitals:   09/05/17 0741  TempSrc: Oral  PainSc:                  Lyn RecordsNoles,  [name]

## 2017-09-05 NOTE — Progress Notes (Signed)
Physical Therapy Treatment Patient Details Name: Phillip Mendez MRN: [id number] DOB: [birth year] Today's Date: 09/05/2017    History of Present Illness Pt is a  67 y.o. male who was admitted to [facility] for an anterior approach Right THA. Pt. PMH includes GERD and arthritis.    PT Comments    Pt able to progress to performing all seated there ex today.  PT issued and reviewed HEP with focus on LE strength.  Pt able to stand from recliner CGA with VC's from PT concerning use of RW for wt shift to L LE.  Pt ambulated to bathroom in pt's room and was able to perform side, retro stepping and maneuver RW over threshold to navigate room.  Pt required assistance to bring R LE over EOB when getting back into bed and PT educated pt concerning use of L LE to assist R LE into bed.  Pt will continue to benefit from skilled PT for strength, functional mobility, tolerance to activity, safe use of RW and therapeutic exercise for pain management.   Follow Up Recommendations  Home health PT     Equipment Recommendations       Recommendations for Other Services       Precautions / Restrictions Precautions Precautions: Anterior Hip Restrictions Weight Bearing Restrictions: Yes RLE Weight Bearing: Weight bearing as tolerated    Mobility  Bed Mobility Overal bed mobility: Needs Assistance Bed Mobility: Sit to Supine       Sit to supine: Min assist   General bed mobility comments: PT assisted pt in bringing R LE over EOB and educated pt concerning use of L LE to assist R LE onto bed.  Transfers Overall transfer level: Needs assistance Equipment used: Rolling walker (2 wheeled) Transfers: Sit to/from Stand Sit to Stand: Supervision         General transfer comment: Pt able to stand on first attempt with VC's for hand placement and requiring increased time.  PT educated pt concerning wt shift and managing WBAT status with RW.  Ambulation/Gait Ambulation/Gait assistance: Supervision Ambulation  Distance (Feet): 50 Feet Assistive device: Rolling walker (2 wheeled)     Gait velocity interpretation: Below normal speed for age/gender General Gait Details: Pt able to ambulate to bathroom in pt room and manage RW safely when crossing threshold.  Pt maintains a slow gait with a step through gait pattern with good arm strength to support R LE.   Stairs            Wheelchair Mobility    Modified Rankin (Stroke Patients Only)       Balance Overall balance assessment: Modified Independent                                          Cognition Arousal/Alertness: Awake/alert Behavior During Therapy: WFL for tasks assessed/performed Overall Cognitive Status: Within Functional Limits for tasks assessed                                        Exercises Total Joint Exercises Gluteal Sets: 5 reps;Both;Seated Heel Slides: AAROM;5 reps;Seated Hip ABduction/ADduction: Strengthening;10 reps;Seated(Pillow squeeze hip adduction x10, hip abduction x10) Long Arc Quad: Strengthening;Right;10 reps;Seated Bridges: (Pt able to perform unilateral partial bridge to scoot up in bed.)    General Comments  Pertinent Vitals/Pain Pain Assessment: 0-10 Pain Score: 3  Pain Location: R hip with seated activity and ambulation. Pain Descriptors / Indicators: Aching Pain Intervention(s): Limited activity within patient's tolerance;Monitored during session    Home Living Family/patient expects to be discharged to:: Private residence Living Arrangements: Spouse/significant other Available Help at Discharge: Family Type of Home: House Home Access: Stairs to enter Entrance Stairs-Rails: Can reach both Home Layout: One level Home Equipment: Cane - single point      Prior Function Level of Independence: Independent with assistive device(s)      Comments: Independent with ADLs, IADLS, and working in his own business.   PT Goals (current goals can now be  found in the care plan section) Acute Rehab PT Goals Patient Stated Goal: To return home and back to active lifestyle as soon as possible. PT Goal Formulation: With patient Time For Goal Achievement: 09/19/17 Potential to Achieve Goals: Good Progress towards PT goals: Progressing toward goals    Frequency    BID      PT Plan Current plan remains appropriate    Co-evaluation              AM-PAC PT "6 Clicks" Daily Activity  Outcome Measure  Difficulty turning over in bed (including adjusting bedclothes, sheets and blankets)?: A Little Difficulty moving from lying on back to sitting on the side of the bed? : A Little Difficulty sitting down on and standing up from a chair with arms (e.g., wheelchair, bedside commode, etc,.)?: A Little Help needed moving to and from a bed to chair (including a wheelchair)?: A Little Help needed walking in hospital room?: A Little Help needed climbing 3-5 steps with a railing? : A Little 6 Click Score: 18    End of Session Equipment Utilized During Treatment: Gait belt Activity Tolerance: Patient tolerated treatment well Patient left: in bed;with bed alarm set;with call bell/phone within reach   PT Visit Diagnosis: Unsteadiness on feet (R26.81);Muscle weakness (generalized) (M62.81);Pain Pain - Right/Left: Right Pain - part of body: Hip     Time: 1350-1415 PT Time Calculation (min) (ACUTE ONLY): 25 min  Charges:  $Therapeutic Exercise: 8-22 mins $Therapeutic Activity: 8-22 mins                    G Codes:  Functional Assessment Tool Used: AM-PAC 6 Clicks Basic Mobility   Glenetta Hew, PT, DPT    Glenetta Hew 09/05/2017, 2:31 PM

## 2017-09-05 NOTE — Progress Notes (Signed)
   Subjective: 1 Day Post-Op Procedure(s) (LRB): TOTAL HIP ARTHROPLASTY ANTERIOR APPROACH (Right) Patient reports pain as 5 on 0-10 scale.   Patient is well, and has had no acute complaints or problems Denies any CP, SOB, ABD pain. We will continue therapy today.  Plan is to go Home after hospital stay.  Objective: Vital signs in last 24 hours: Temp:  [97.2 F (36.2 C)-98.6 F (37 C)] 98.3 F (36.8 C) (04/03 0741) Pulse Rate:  [50-70] 67 (04/03 0741) Resp:  [13-19] 18 (04/03 0741) BP: (101-161)/(58-100) 139/86 (04/03 0741) SpO2:  [95 %-100 %] 98 % (04/03 0741) Weight:  [86.2 kg (190 lb)] 86.2 kg (190 lb) (04/02 1446)  Intake/Output from previous day: 04/02 0701 - 04/03 0700 In: 600 [I.V.:600] Out: 1400 [Urine:1150; Blood:250] Intake/Output this shift: No intake/output data recorded.  Recent Labs    09/04/17 2133  HGB 13.6   Recent Labs    09/04/17 2133  WBC 11.7*  RBC 4.35*  HCT 41.0  PLT 192   Recent Labs    09/04/17 2133  CREATININE 1.16   No results for input(s): LABPT, INR in the last 72 hours.  EXAM General - Patient is Alert, Appropriate and Oriented Extremity - Neurovascular intact Sensation intact distally Intact pulses distally Dorsiflexion/Plantar flexion intact No cellulitis present Compartment soft Dressing - dressing C/D/I and no drainage.  Motor Function - intact, moving foot and toes well on exam.   Past Medical History:  Diagnosis Date  . Arthritis   . GERD (gastroesophageal reflux disease)     Assessment/Plan:   1 Day Post-Op Procedure(s) (LRB): TOTAL HIP ARTHROPLASTY ANTERIOR APPROACH (Right) Active Problems:   Post-traumatic osteoarthritis of right hip  Estimated body mass index is 26.88 kg/m as calculated from the following:   Height as of this encounter: 5' 10.5" (1.791 m).   Weight as of this encounter: 86.2 kg (190 lb). Advance diet Up with therapy  Needs BM Labs stable, recheck labs in the morning Vital signs are  stable Care management to assist with discharge to home with home health PT  DVT Prophylaxis - Lovenox, Foot Pumps and TED hose Weight-Bearing as tolerated to right leg   T. Cranston Neighborhris Gaines, PA-C [facility] 09/05/2017, 8:31 AM

## 2017-09-05 NOTE — NC FL2 (Signed)
[place] MEDICAID FL2 LEVEL OF CARE SCREENING TOOL     IDENTIFICATION  Patient Name: Phillip Mendez Birthdate: [birth year] Sex: male Admission Date (Current Location): 09/04/2017  [place] and [place] Number:  Chiropodist and Address:  [facility], [address] [place], [place] [address]      Provider Number: [id number]  Attending Physician Name and Address:  Kennedy Bucker, MD  Relative Name and Phone Number:       Current Level of Care: Hospital Recommended Level of Care: Skilled Nursing Facility Prior Approval Number:    Date Approved/Denied:   PASRR Number: [id number])  Discharge Plan: SNF    Current Diagnoses: Patient Active Problem List   Diagnosis Date Noted  . Post-traumatic osteoarthritis of right hip 09/04/2017    Orientation RESPIRATION BLADDER Height & Weight     Self, Time, Situation, Place  Normal Continent Weight: 190 lb (86.2 kg) Height:  5' 10.5" (179.1 cm)  BEHAVIORAL SYMPTOMS/MOOD NEUROLOGICAL BOWEL NUTRITION STATUS      Continent Diet(Diet: Regular )  AMBULATORY STATUS COMMUNICATION OF NEEDS Skin   Extensive Assist Verbally Surgical wounds(Incision: Right Hip. )                       Personal Care Assistance Level of Assistance  Bathing, Feeding, Dressing Bathing Assistance: Limited assistance Feeding assistance: Independent Dressing Assistance: Limited assistance     Functional Limitations Info  Sight, Hearing, Speech Sight Info: Adequate Hearing Info: Adequate Speech Info: Adequate    SPECIAL CARE FACTORS FREQUENCY  PT (By licensed PT), OT (By licensed OT)     PT Frequency: (5) OT Frequency: (5)            Contractures      Additional Factors Info  Code Status, Allergies Code Status Info: (Full Code. ) Allergies Info: (no Known Allergies. )           Current Medications (09/05/2017):  This is the current hospital active medication list Current Facility-Administered  Medications  Medication Dose Route Frequency Provider Last Rate Last Dose  . 0.9 %  sodium chloride infusion   Intravenous Continuous Kennedy Bucker, MD 100 mL/hr at 09/05/17 0515    . acetaminophen (TYLENOL) tablet 1,000 mg  1,000 mg Oral Q6H Kennedy Bucker, MD   1,000 mg at 09/05/17 0514  . acetaminophen (TYLENOL) tablet 325-650 mg  325-650 mg Oral Q6H PRN Kennedy Bucker, MD      . alum & mag hydroxide-simeth (MAALOX/MYLANTA) 200-200-20 MG/5ML suspension 30 mL  30 mL Oral Q4H PRN Kennedy Bucker, MD      . bisacodyl (DULCOLAX) suppository 10 mg  10 mg Rectal Daily PRN Kennedy Bucker, MD      . ceFAZolin (ANCEF) IVPB 2g/100 mL premix  2 g Intravenous Q6H Kennedy Bucker, MD   Stopped at 09/05/17 [phone]  . diphenhydrAMINE (BENADRYL) 12.5 MG/5ML elixir 12.5-25 mg  12.5-25 mg Oral Q4H PRN Kennedy Bucker, MD      . docusate sodium (COLACE) capsule 100 mg  100 mg Oral BID Kennedy Bucker, MD   100 mg at 09/05/17 0853  . enoxaparin (LOVENOX) injection 40 mg  40 mg Subcutaneous Q24H Kennedy Bucker, MD   40 mg at 09/05/17 0853  . gabapentin (NEURONTIN) capsule 300 mg  300 mg Oral TID Kennedy Bucker, MD   300 mg at 09/05/17 0853  . HYDROmorphone (DILAUDID) injection 0.5-1 mg  0.5-1 mg Intravenous Q4H PRN Kennedy Bucker, MD      .  magnesium citrate solution 1 Bottle  1 Bottle Oral Once PRN Kennedy BuckerMenz, Michael, MD      . menthol-cetylpyridinium (CEPACOL) lozenge 3 mg  1 lozenge Oral PRN Kennedy BuckerMenz, Michael, MD       Or  . phenol (CHLORASEPTIC) mouth spray 1 spray  1 spray Mouth/Throat PRN Kennedy BuckerMenz, Michael, MD      . methocarbamol (ROBAXIN) tablet 500 mg  500 mg Oral Q6H PRN Kennedy BuckerMenz, Michael, MD       Or  . methocarbamol (ROBAXIN) 500 mg in dextrose 5 % 50 mL IVPB  500 mg Intravenous Q6H PRN Kennedy BuckerMenz, Michael, MD      . metoCLOPramide (REGLAN) tablet 5-10 mg  5-10 mg Oral Q8H PRN Kennedy BuckerMenz, Michael, MD       Or  . metoCLOPramide (REGLAN) injection 5-10 mg  5-10 mg Intravenous Q8H PRN Kennedy BuckerMenz, Michael, MD      . ondansetron [facility](ZOFRAN) tablet 4 mg  4 mg  Oral Q6H PRN Kennedy BuckerMenz, Michael, MD       Or  . ondansetron [facility](ZOFRAN) injection 4 mg  4 mg Intravenous Q6H PRN Kennedy BuckerMenz, Michael, MD      . oxyCODONE (Oxy IR/ROXICODONE) immediate release tablet 10-15 mg  10-15 mg Oral Q4H PRN Kennedy BuckerMenz, Michael, MD      . oxyCODONE (Oxy IR/ROXICODONE) immediate release tablet 5-10 mg  5-10 mg Oral Q4H PRN Kennedy BuckerMenz, Michael, MD   10 mg at 09/05/17 0645  . senna-docusate (Senokot-S) tablet 1 tablet  1 tablet Oral QHS PRN Kennedy BuckerMenz, Michael, MD      . traMADol Janean Sark(ULTRAM) tablet 50 mg  50 mg Oral Q6H Kennedy BuckerMenz, Michael, MD   50 mg at 09/05/17 0514  . zolpidem (AMBIEN) tablet 5 mg  5 mg Oral QHS PRN Kennedy BuckerMenz, Michael, MD         Discharge Medications: Please see discharge summary for a list of discharge medications.  Relevant Imaging Results:  Relevant Lab Results:   Additional Information (SSN: [id number])  Phillip Mendez, Darleen CrockerBailey M, LCSW

## 2017-09-06 ENCOUNTER — Encounter: Payer: Self-pay | Admitting: Orthopedic Surgery

## 2017-09-06 LAB — BASIC METABOLIC PANEL
Anion gap: 5 (ref 5–15)
BUN: 18 mg/dL (ref 6–20)
CALCIUM: 8.5 mg/dL — AB (ref 8.9–10.3)
CO2: 29 mmol/L (ref 22–32)
CREATININE: 1.24 mg/dL (ref 0.61–1.24)
Chloride: 101 mmol/L (ref 101–111)
GFR calc Af Amer: >60 mL/min (ref >60)
GFR calc non Af Amer: 59 mL/min — ABNORMAL LOW (ref >60)
GLUCOSE: 140 mg/dL — AB (ref 65–99)
Potassium: 4.6 mmol/L (ref 3.5–5.1)
Sodium: 135 mmol/L (ref 135–145)

## 2017-09-06 LAB — CBC
HCT: 39.2 % — ABNORMAL LOW (ref 40.0–52.0)
Hemoglobin: 13 g/dL (ref 13.0–18.0)
MCH: 31.7 pg (ref 26.0–34.0)
MCHC: 33.2 g/dL (ref 32.0–36.0)
MCV: 95.4 fL (ref 80.0–100.0)
PLATELETS: 177 10*3/uL (ref 150–440)
RBC: 4.11 MIL/uL — ABNORMAL LOW (ref 4.40–5.90)
RDW: 14.8 % — AB (ref 11.5–14.5)
WBC: 9 10*3/uL (ref 3.8–10.6)

## 2017-09-06 LAB — SURGICAL PATHOLOGY

## 2017-09-06 MED ORDER — OXYCODONE HCL 5 MG PO TABS: 5.0000 mg | ORAL_TABLET | ORAL | 0 refills | Status: DC | PRN

## 2017-09-06 MED ORDER — ENOXAPARIN SODIUM 40 MG/0.4ML ~~LOC~~ SOLN: 40.0000 mg | SUBCUTANEOUS | 0 refills | Status: DC

## 2017-09-06 MED ORDER — METHOCARBAMOL 500 MG PO TABS: 500.0000 mg | ORAL_TABLET | Freq: Four times a day (QID) | ORAL | 0 refills | Status: DC | PRN

## 2017-09-06 MED ORDER — ACETAMINOPHEN 325 MG PO TABS: 325.0000 mg | ORAL_TABLET | Freq: Four times a day (QID) | ORAL | Status: AC | PRN

## 2017-09-06 NOTE — Discharge Instructions (Signed)

## 2017-09-06 NOTE — Progress Notes (Signed)
Physical Therapy Treatment Patient Details Name: Phillip Mendez MRN: [id number] DOB: [birth year] Today's Date: 09/06/2017    History of Present Illness Pt is a  67 y.o. male who was admitted to [facility] for an anterior approach Right THA. Pt. PMH includes GERD and arthritis.    PT Comments    Pt able to progress bed exercises repetitions today and demonstrate understanding of execution of each exercise.  PT introduced bridge exercise which pt was able to perform at 75% of range with report of no pain increase.  He reports being comfortable with his HEP.  He reported decreased pain, 1/10, in R hip.  Pt able to stand and ambulate to restroom with supervision and RW.  Pt able to perform lateral and retro walking to navigate bathroom with supervision.  PT provided education concerning progression of there ex and use of AD and pt expressed understanding.    Follow Up Recommendations  Home health PT     Equipment Recommendations       Recommendations for Other Services       Precautions / Restrictions Precautions Precautions: Anterior Hip Restrictions Weight Bearing Restrictions: Yes RLE Weight Bearing: Weight bearing as tolerated    Mobility  Bed Mobility Overal bed mobility: Needs Assistance         Sit to supine: Min assist   General bed mobility comments: Pt able to perform all bed mobility with Mod I except bringing R LE over EOB when performing sit to supine.  Transfers Overall transfer level: Needs assistance Equipment used: Rolling walker (2 wheeled) Transfers: Sit to/from Stand Sit to Stand: Supervision         General transfer comment: Pt demonstrated greater stability with STS transfer.  Still reports tightness in posterior thigh when standing initially.  Ambulation/Gait Ambulation/Gait assistance: Supervision Ambulation Distance (Feet): 40 Feet Assistive device: Rolling walker (2 wheeled)     Gait velocity interpretation: Below normal speed for  age/gender General Gait Details: Ambulated in room with RW.  Pt able to demonstrate good foot clearance and step through gait pattern bilaterally.   Stairs            Wheelchair Mobility    Modified Rankin (Stroke Patients Only)       Balance Overall balance assessment: Modified Independent                                          Cognition Arousal/Alertness: Awake/alert Behavior During Therapy: WFL for tasks assessed/performed Overall Cognitive Status: Within Functional Limits for tasks assessed                                        Exercises Total Joint Exercises Quad Sets: Strengthening;Right;15 reps;Supine Towel Squeeze: Strengthening;Both;15 reps;Supine Short Arc Quad: Strengthening;Right;15 reps;Supine Heel Slides: AAROM;15 reps;Supine Hip ABduction/ADduction: AAROM;10 reps;Supine Bridges: Strengthening;5 reps;Both;Supine(VC's for controlled motion and 3 sec hold)    General Comments        Pertinent Vitals/Pain Pain Assessment: 0-10 Pain Score: 1  Pain Location: R hip    Home Living                      Prior Function            PT Goals (current goals can now be found in the care  plan section) Acute Rehab PT Goals Patient Stated Goal: To return home and back to active lifestyle as soon as possible. PT Goal Formulation: With patient Time For Goal Achievement: 09/19/17 Potential to Achieve Goals: Good Progress towards PT goals: Progressing toward goals    Frequency    BID      PT Plan Current plan remains appropriate    Co-evaluation              AM-PAC PT "6 Clicks" Daily Activity  Outcome Measure  Difficulty turning over in bed (including adjusting bedclothes, sheets and blankets)?: None Difficulty moving from lying on back to sitting on the side of the bed? : A Little Difficulty sitting down on and standing up from a chair with arms (e.g., wheelchair, bedside commode, etc,.)?: A  Little Help needed moving to and from a bed to chair (including a wheelchair)?: A Little Help needed walking in hospital room?: A Little Help needed climbing 3-5 steps with a railing? : A Little 6 Click Score: 19    End of Session Equipment Utilized During Treatment: Gait belt Activity Tolerance: Patient tolerated treatment well Patient left: in bed;with call bell/phone within reach;with bed alarm set   PT Visit Diagnosis: Unsteadiness on feet (R26.81);Muscle weakness (generalized) (M62.81);Pain Pain - Right/Left: Right Pain - part of body: Hip     Time: [id number] PT Time Calculation (min) (ACUTE ONLY): 25 min  Charges:  $Therapeutic Exercise: 8-22 mins $Therapeutic Activity: 8-22 mins                    G Codes:  Functional Assessment Tool Used: AM-PAC 6 Clicks Basic Mobility    Glenetta HewSarah Abegail Kloeppel, PT, DPT    Glenetta HewSarah Brizza Nathanson 09/06/2017, 5:24 PM

## 2017-09-06 NOTE — Care Management (Signed)
Cost of Lovenox is $ 85.00. Patient updated and denies issues paying for medication.

## 2017-09-06 NOTE — Progress Notes (Signed)
Physical Therapy Treatment Patient Details Name: Phillip Mendez MRN: [id number] DOB: 24-Feb-1951 Today's Date: 09/06/2017    History of Present Illness Pt is a  67 y.o. male who was admitted to [facility] for an anterior approach Right THA. Pt. PMH includes GERD and arthritis.    PT Comments    Pt was able to progress to stair negotiation today with close CGA and education from PT concerning leading limb and step pattern.  PT advised pt to perform there ex prior to ambulation and stair negotiation to ensure safe ROM and function.  PT educated pt concerning importance of slow progression to mobility at initiation of movement following prolonged sitting as well as pacing progress of HEP.  He was able to complete all sets of exercises at bedside and has no questions concerning HEP.  Pt stated understanding.  Pt ambulated 100 ft with RW and close CGA, demonstrating fatigue for the last 20 ft and demonstrating a step to gait pattern with wt shift to L side.  Pt will continue to benefit from skilled PT with focus on strength, functional mobility, tolerance to activity, stair negotiation, safe use of RW and therapeutic exercise.  Follow Up Recommendations  Home health PT     Equipment Recommendations       Recommendations for Other Services       Precautions / Restrictions Precautions Precautions: Anterior Hip Restrictions Weight Bearing Restrictions: Yes RLE Weight Bearing: Weight bearing as tolerated    Mobility  Bed Mobility Overal bed mobility: Modified Independent Bed Mobility: Supine to Sit     Supine to sit: Modified independent (Device/Increase time);HOB elevated Sit to supine: (Did not perform.)      Transfers Overall transfer level: Needs assistance Equipment used: Rolling walker (2 wheeled) Transfers: Sit to/from Stand Sit to Stand: Min guard         General transfer comment: PT provided close CGA and pt was able to perform STS from bed with use of bilateral  UE.  Ambulation/Gait Ambulation/Gait assistance: Min guard Ambulation Distance (Feet): 100 Feet Assistive device: Rolling walker (2 wheeled)     Gait velocity interpretation: at or above normal speed for age/gender General Gait Details: Pt able to ambulate to stairway and negotiate 3 steps with close CGA.  Pt required 3 rest breaks during 100 ft of ambulation and was able to maintain a step through gait thoughout all but the last 20 ft.  Pt then demonstrated a step to gait pattern and PT advised pt to wt shift to L LE and use UE's to control WB.   Stairs Stairs: Yes   Stair Management: Two rails;Step to pattern;Forwards   General stair comments: Pt was able to ascend/descend 3 steps with close CGA and PT education concerning leading limb and step to gait pattern.  PT also educated pt concerning using folded RW to navigate steps if pt only has HR available on one side.  Wheelchair Mobility    Modified Rankin (Stroke Patients Only)       Balance Overall balance assessment: Modified Independent                                          Cognition Arousal/Alertness: Awake/alert Behavior During Therapy: WFL for tasks assessed/performed Overall Cognitive Status: Within Functional Limits for tasks assessed  Exercises Total Joint Exercises Long Arc Quad: Strengthening;10 reps;Right;Seated Other Exercises Other Exercises: Performed STS x3 with close CGA and pt use of bilateral UE.    General Comments        Pertinent Vitals/Pain Pain Assessment: 0-10 Pain Score: 3  Pain Location: R hip Pain Descriptors / Indicators: Aching Pain Intervention(s): Limited activity within patient's tolerance;Monitored during session    Home Living                      Prior Function            PT Goals (current goals can now be found in the care plan section) Acute Rehab PT Goals Patient Stated Goal: To  return home and back to active lifestyle as soon as possible. PT Goal Formulation: With patient Time For Goal Achievement: 09/19/17 Potential to Achieve Goals: Good Progress towards PT goals: Progressing toward goals    Frequency    BID      PT Plan Current plan remains appropriate    Co-evaluation              AM-PAC PT "6 Clicks" Daily Activity  Outcome Measure  Difficulty turning over in bed (including adjusting bedclothes, sheets and blankets)?: A Little Difficulty moving from lying on back to sitting on the side of the bed? : A Little Difficulty sitting down on and standing up from a chair with arms (e.g., wheelchair, bedside commode, etc,.)?: A Little Help needed moving to and from a bed to chair (including a wheelchair)?: A Little Help needed walking in hospital room?: A Little Help needed climbing 3-5 steps with a railing? : A Little 6 Click Score: 18    End of Session Equipment Utilized During Treatment: Gait belt Activity Tolerance: Patient tolerated treatment well Patient left: in chair;with call bell/phone within reach;with chair alarm set Nurse Communication: Mobility status PT Visit Diagnosis: Unsteadiness on feet (R26.81);Muscle weakness (generalized) (M62.81);Pain Pain - Right/Left: Right Pain - part of body: Hip     Time: 1100-1125 PT Time Calculation (min) (ACUTE ONLY): 25 min  Charges:  $Gait Training: 8-22 mins $Therapeutic Exercise: 8-22 mins                    G Codes:  Functional Assessment Tool Used: AM-PAC 6 Clicks Basic Mobility    Glenetta HewSarah Joron Velis, PT, DPT    Glenetta HewSarah Marquavion Venhuizen 09/06/2017, 11:37 AM

## 2017-09-06 NOTE — Discharge Summary (Signed)
Physician Discharge Summary  Patient ID: Phillip Mendez MRN: [id number] DOB/AGE: [birth year] 67 y.o.  Admit date: 09/04/2017 Discharge date: 09/07/2017 Admission Diagnoses:  post traumatic osteoarthritis of right hip   Discharge Diagnoses: Patient Active Problem List   Diagnosis Date Noted  . Post-traumatic osteoarthritis of right hip 09/04/2017    Past Medical History:  Diagnosis Date  . Arthritis   . GERD (gastroesophageal reflux disease)      Transfusion: none   Consultants (if any):   Discharged Condition: Improved  Hospital Course: Vontae Court is an 67 y.o. male who was admitted 09/04/2017 with a diagnosis of right hip osteoarthritis and went to the operating room on 09/04/2017 and underwent the above named procedures.    Surgeries: Procedure(s): TOTAL HIP ARTHROPLASTY ANTERIOR APPROACH on 09/04/2017 Patient tolerated the surgery well. Taken to PACU where she was stabilized and then transferred to the orthopedic floor.  Started on Lovenox 40 mg q 24 hrs. Foot pumps applied bilaterally at 80 mm. Heels elevated on bed with rolled towels. No evidence of DVT. Negative Homan. Physical therapy started on day #1 for gait training and transfer. OT started day #1 for ADL and assisted devices.  Patient's foley was d/c on day #1. Patient's IV was d/c on day #2.  On post op day #3 patient was stable and ready for discharge to home with HHPT.  Implants: Medacta 4 standard AMIS, 56 mm Mpact DM cup and liner, XL 28 mm metal head    He was given perioperative antibiotics:  Anti-infectives (From admission, onward)   Start     Dose/Rate Route Frequency Ordered Stop   09/04/17 2300  ceFAZolin (ANCEF) IVPB 2g/100 mL premix     2 g 200 mL/hr over 30 Minutes Intravenous Every 6 hours 09/04/17 1958 09/05/17 1120   09/04/17 1439  ceFAZolin (ANCEF) 2-4 GM/100ML-% IVPB    Note to Pharmacy:  Manfred Arch   : cabinet override      09/04/17 1439 09/04/17 1700   09/03/17 2215  ceFAZolin  (ANCEF) IVPB 2g/100 mL premix     2 g 200 mL/hr over 30 Minutes Intravenous  Once 09/03/17 2214 09/04/17 1730    .  He was given sequential compression devices, early ambulation, and Lovenox for DVT prophylaxis.  He benefited maximally from the hospital stay and there were no complications.    Recent vital signs:  Vitals:   09/05/17 1529 09/05/17 2341  BP: (!) 145/85 139/74  Pulse: 68 77  Resp: 20 18  Temp: 98.6 F (37 C) 98.4 F (36.9 C)  SpO2: 99% 98%    Recent laboratory studies:  Lab Results  Component Value Date   HGB 13.0 09/06/2017   HGB 13.6 09/04/2017   HGB 15.4 08/29/2017   Lab Results  Component Value Date   WBC 9.0 09/06/2017   PLT 177 09/06/2017   Lab Results  Component Value Date   INR 0.88 08/29/2017   Lab Results  Component Value Date   NA 135 09/06/2017   K 4.6 09/06/2017   CL 101 09/06/2017   CO2 29 09/06/2017   BUN 18 09/06/2017   CREATININE 1.24 09/06/2017   GLUCOSE 140 (H) 09/06/2017    Discharge Medications:   Allergies as of 09/06/2017   No Known Allergies     Medication List    STOP taking these medications   naproxen sodium 220 MG tablet Commonly known as:  ALEVE     TAKE these medications   acetaminophen 325 MG tablet Commonly  known as:  TYLENOL Take 1-2 tablets (325-650 mg total) by mouth every 6 (six) hours as needed for mild pain (pain score 1-3 or temp > 100.5).   calcium carbonate 500 MG chewable tablet Commonly known as:  TUMS - dosed in mg elemental calcium Chew 2 tablets by mouth daily as needed for indigestion or heartburn.   enoxaparin 40 MG/0.4ML injection Commonly known as:  LOVENOX Inject 0.4 mLs (40 mg total) into the skin daily for 14 days. Start taking on:  09/07/2017   methocarbamol 500 MG tablet Commonly known as:  ROBAXIN Take 1 tablet (500 mg total) by mouth every 6 (six) hours as needed for muscle spasms.   oxyCODONE 5 MG immediate release tablet Commonly known as:  Oxy IR/ROXICODONE Take 1-2  tablets (5-10 mg total) by mouth every 4 (four) hours as needed for moderate pain (pain score 4-6).   sildenafil 20 MG tablet Commonly known as:  REVATIO Take 20 mg by mouth daily as needed for erectile dysfunction.   terbinafine 250 MG tablet Commonly known as:  LAMISIL Take 500 mg by mouth daily.            Durable Medical Equipment  (From admission, onward)        Start     Ordered   09/05/17 1329  For home use only DME Walker rolling  Once    Question:  Patient needs a walker to treat with the following condition  Answer:  Weakness   09/05/17 1329      Diagnostic Studies: Dg Hip Operative Unilat W Or W/o Pelvis Right  Result Date: 09/04/2017 CLINICAL DATA:  Right hip replacement EXAM: OPERATIVE RIGHT HIP WITH PELVIS COMPARISON:  None. FLUOROSCOPY TIME:  Radiation Exposure Index (as provided by the fluoroscopic device): 9 mGy If the device does not provide the exposure index: Fluoroscopy Time:  36 seconds Number of Acquired Images:  3 FINDINGS: Initial images demonstrates severe degenerative changes of the right hip joint. Subsequent right hip replacement is noted in satisfactory position. Changes of prior hernia repair are noted. IMPRESSION: Status post right hip replacement. Electronically Signed   By: Alcide CleverMark  Lukens M.D.   On: 09/04/2017 18:35   Dg Hip Unilat W Or W/o Pelvis 2-3 Views Right  Result Date: 09/04/2017 CLINICAL DATA:  Status post right hip replacement EXAM: DG HIP (WITH OR WITHOUT PELVIS) 2-3V RIGHT COMPARISON:  None. FINDINGS: Right hip replacement is noted. No acute bony or soft tissue abnormality is seen. IMPRESSION: Status post right hip replacement Electronically Signed   By: Alcide CleverMark  Lukens M.D.   On: 09/04/2017 19:15    Disposition:     Follow-up Information    Kennedy BuckerMenz, Michael, MD Follow up in 2 week(s).   Specialty:  Orthopedic Surgery Contact information: [address] [facility] [place] [address] [phone]             Signed: Amador CunasGAINES, Jaquel Glassburn [facility]ertonCHRISTOPHER 09/06/2017, 8:46 AM

## 2017-09-06 NOTE — Progress Notes (Signed)
   Subjective: 2 Days Post-Op Procedure(s) (LRB): TOTAL HIP ARTHROPLASTY ANTERIOR APPROACH (Right) Patient reports pain as mild.   Patient is well, and has had no acute complaints or problems Denies any CP, SOB, ABD pain. We will continue therapy today.  Plan is to go Home after hospital stay.  Objective: Vital signs in last 24 hours: Temp:  [98.3 F (36.8 C)-98.6 F (37 C)] 98.4 F (36.9 C) (04/03 2341) Pulse Rate:  [68-77] 77 (04/03 2341) Resp:  [18-20] 18 (04/03 2341) BP: (139-157)/(74-87) 139/74 (04/03 2341) SpO2:  [96 %-99 %] 98 % (04/03 2341)  Intake/Output from previous day: 04/03 0701 - 04/04 0700 In: 1565 [P.O.:1080; I.V.:485] Out: -  Intake/Output this shift: No intake/output data recorded.  Recent Labs    09/04/17 2133 09/06/17 0458  HGB 13.6 13.0   Recent Labs    09/04/17 2133 09/06/17 0458  WBC 11.7* 9.0  RBC 4.35* 4.11*  HCT 41.0 39.2*  PLT 192 177   Recent Labs    09/04/17 2133 09/06/17 0458  NA  --  135  K  --  4.6  CL  --  101  CO2  --  29  BUN  --  18  CREATININE 1.16 1.24  GLUCOSE  --  140*  CALCIUM  --  8.5*   No results for input(s): LABPT, INR in the last 72 hours.  EXAM General - Patient is Alert, Appropriate and Oriented Extremity - Neurovascular intact Sensation intact distally Intact pulses distally Dorsiflexion/Plantar flexion intact No cellulitis present Compartment soft Dressing - dressing C/D/I and no drainage.  Motor Function - intact, moving foot and toes well on exam.   Past Medical History:  Diagnosis Date  . Arthritis   . GERD (gastroesophageal reflux disease)     Assessment/Plan:   2 Days Post-Op Procedure(s) (LRB): TOTAL HIP ARTHROPLASTY ANTERIOR APPROACH (Right) Active Problems:   Post-traumatic osteoarthritis of right hip  Estimated body mass index is 26.88 kg/m as calculated from the following:   Height as of this encounter: 5' 10.5" (1.791 m).   Weight as of this encounter: 86.2 kg (190  lb). Advance diet Up with therapy  Needs BM Labs stable Vital signs are stable Care management to assist with discharge to home with home health PT Possible discharge home with HHPT today pending BM  DVT Prophylaxis - Lovenox, Foot Pumps and TED hose Weight-Bearing as tolerated to right leg   T. Cranston Neighborhris Gaines, PA-C [facility] 09/06/2017, 8:40 AM

## 2017-09-06 NOTE — Progress Notes (Signed)
Patient non compliant with safety measures discussed this am.

## 2017-09-07 NOTE — Care Management Important Message (Signed)
Important Message  Patient Details  Name: Phillip CulverKeith Carmon MRN: [id number] Date of Birth: 1951-01-01   Medicare Important Message Given:  Yes    Olegario MessierKathy A Khloee Garza 09/07/2017, 9:56 AM

## 2017-09-07 NOTE — Progress Notes (Signed)
   Subjective: 3 Days Post-Op Procedure(s) (LRB): TOTAL HIP ARTHROPLASTY ANTERIOR APPROACH (Right) Patient reports pain as mild.   Patient is well, and has had no acute complaints or problems Denies any CP, SOB, ABD pain. We will continue therapy today.  Plan is to go Home after hospital stay.  Objective: Vital signs in last 24 hours: Temp:  [98.4 F (36.9 C)-98.7 F (37.1 C)] 98.4 F (36.9 C) (04/04 2306) Pulse Rate:  [73-87] 73 (04/04 2306) Resp:  [18] 18 (04/04 2306) BP: (136-140)/(83-88) 136/83 (04/04 2306) SpO2:  [93 %] 93 % (04/04 2306)  Intake/Output from previous day: 04/04 0701 - 04/05 0700 In: 720 [P.O.:720] Out: -  Intake/Output this shift: No intake/output data recorded.  Recent Labs    09/04/17 2133 09/06/17 0458  HGB 13.6 13.0   Recent Labs    09/04/17 2133 09/06/17 0458  WBC 11.7* 9.0  RBC 4.35* 4.11*  HCT 41.0 39.2*  PLT 192 177   Recent Labs    09/04/17 2133 09/06/17 0458  NA  --  135  K  --  4.6  CL  --  101  CO2  --  29  BUN  --  18  CREATININE 1.16 1.24  GLUCOSE  --  140*  CALCIUM  --  8.5*   No results for input(s): LABPT, INR in the last 72 hours.  EXAM General - Patient is Alert, Appropriate and Oriented Extremity - Neurovascular intact Sensation intact distally Intact pulses distally Dorsiflexion/Plantar flexion intact No cellulitis present Compartment soft Dressing - dressing C/D/I and no drainage.  Motor Function - intact, moving foot and toes well on exam.   Past Medical History:  Diagnosis Date  . Arthritis   . GERD (gastroesophageal reflux disease)     Assessment/Plan:   3 Days Post-Op Procedure(s) (LRB): TOTAL HIP ARTHROPLASTY ANTERIOR APPROACH (Right) Active Problems:   Post-traumatic osteoarthritis of right hip  Estimated body mass index is 26.88 kg/m as calculated from the following:   Height as of this encounter: 5' 10.5" (1.791 m).   Weight as of this encounter: 86.2 kg (190 lb). Advance diet Up  with therapy   Discharged home with home health PT today. [facility] weeks   DVT Prophylaxis - Lovenox, Foot Pumps and TED hose Weight-Bearing as tolerated to right leg   T. Cranston Neighborhris Gaines, PA-C [facility] 09/07/2017, 7:57 AM

## 2017-09-07 NOTE — Care Management Note (Signed)
Case Management Note  Patient Details  Name: Seichi Yeatman MRN: [id number] DaRoselee Culverte of Birth: 10-Aug-1950  Subjective/Objective:  Discharging today                  Action/Plan: [facility] notified of discharge  Expected Discharge Date:  09/07/17               Expected Discharge Plan:  Home w Home Health Services  In-House Referral:     Discharge planning Services  CM Consult  Post Acute Care Choice:  Durable Medical Equipment, Home Health Choice offered to:  Patient  DME Arranged:  Walker rolling DME Agency:  [facility]  [facility]  [facility]  Status of Service:  Completed, signed off  If discussed at Long Length of Stay Meetings, dates discussed:    Additional Comments:  Marily MemosLisa M Manila Rommel, RN 09/07/2017, 8:44 AM

## 2017-09-07 NOTE — Progress Notes (Signed)
Discharge summary reviewed with verbal understanding. 3 Rxs given upon discharge. Escorted to personal vehicle via wc

## 2017-09-07 NOTE — Progress Notes (Signed)
Physical Therapy Treatment Patient Details Name: Phillip CulverKeith Mendez MRN: [id number] DOB: 20-Nov-1950 Today's Date: 09/07/2017    History of Present Illness Pt is a  67 y.o. male who [facility] approach Right THA. Pt. PMH includes GERD and arthritis.    PT Comments    Patient able to progress to standing LE there ex today.  PT provided HEP and educated pt concerning slow intentional movements as well as importance of standing near a counter for support when performing standing exercises.  Pt able to complete sets of 10 of all exercises.  Pt reported 3/10 pain and stated that it may be higher during gait but also recognized that he is due to pain medication.  Pt was able to ambulate 75 ft with RW and PT provided gait training regarding correction of tendency to IR R LE when initiating swing phase.  Pt was able to correct and appeared more steady with ambulation following.   Pt will continue to benefit from skilled PT with focus on strength, gait training, safe use of RW, pain management and tolerance to activity.  Follow Up Recommendations  Home health PT     Equipment Recommendations       Recommendations for Other Services       Precautions / Restrictions Precautions Precautions: Anterior Hip Restrictions Weight Bearing Restrictions: Yes RLE Weight Bearing: Weight bearing as tolerated    Mobility  Bed Mobility Overal bed mobility: Independent                Transfers Overall transfer level: Needs assistance Equipment used: Rolling walker (2 wheeled) Transfers: Sit to/from Stand Sit to Stand: Supervision         General transfer comment: Able to stand with use of RW. Sometimes impulsive and requires VC's for slow intentional movement.  Ambulation/Gait Ambulation/Gait assistance: Supervision Ambulation Distance (Feet): 100 Feet Assistive device: Rolling walker (2 wheeled)     Gait velocity interpretation: Below normal speed for  age/gender General Gait Details: Pt able to ambulate 75 ft with RW, demonstrating swing through gait bilaterally.  PT discussed pt tendency to IR R hip to initate swing phase and pt stated that he thinks it is a habit developed as hip pain progressed for the past 3 years.  Pt was able to correct when made aware.   Stairs            Wheelchair Mobility    Modified Rankin (Stroke Patients Only)       Balance Overall balance assessment: Modified Independent                                          Cognition Arousal/Alertness: Awake/alert Behavior During Therapy: WFL for tasks assessed/performed Overall Cognitive Status: Within Functional Limits for tasks assessed                                        Exercises General Exercises - Lower Extremity Long Arc Quad: Strengthening;Right;Standing;10 reps Hip ABduction/ADduction: Strengthening;Right;10 reps;Standing Heel Raises: Strengthening;10 reps;Standing;Both Mini-Sqauts: Strengthening;10 reps;Standing;Both Other Exercises Other Exercises: Issued and reviewed standing LE exercise HEP and educated pt concerning slow controlled movement and the priniciple of overload.    General Comments        Pertinent Vitals/Pain Pain Assessment: 0-10 Pain Score: 3  Pain Location: R hip.  Pt due for pain medication. Pain Descriptors / Indicators: Aching Pain Intervention(s): Limited activity within patient's tolerance;Monitored during session    Home Living                      Prior Function            PT Goals (current goals can now be found in the care plan section) Acute Rehab PT Goals Patient Stated Goal: To return home and back to active lifestyle as soon as possible. PT Goal Formulation: With patient Time For Goal Achievement: 09/19/17 Potential to Achieve Goals: Good Progress towards PT goals: Progressing toward goals    Frequency    BID      PT Plan Current plan  remains appropriate    Co-evaluation              AM-PAC PT "6 Clicks" Daily Activity  Outcome Measure  Difficulty turning over in bed (including adjusting bedclothes, sheets and blankets)?: None Difficulty moving from lying on back to sitting on the side of the bed? : None Difficulty sitting down on and standing up from a chair with arms (e.g., wheelchair, bedside commode, etc,.)?: A Little Help needed moving to and from a bed to chair (including a wheelchair)?: A Little Help needed walking in hospital room?: A Little Help needed climbing 3-5 steps with a railing? : A Little 6 Click Score: 20    End of Session Equipment Utilized During Treatment: Gait belt Activity Tolerance: Patient limited by pain Patient left: in bed;with call bell/phone within reach;with bed alarm set   PT Visit Diagnosis: Unsteadiness on feet (R26.81);Pain;Muscle weakness (generalized) (M62.81) Pain - Right/Left: Right Pain - part of body: Hip     Time: 0725-0750 PT Time Calculation (min) (ACUTE ONLY): 25 min  Charges:  $Gait Training: 8-22 mins $Therapeutic Exercise: 8-22 mins                    G Codes:  Functional Assessment Tool Used: AM-PAC 6 Clicks Basic Mobility    Glenetta Hew, PT, DPT    Glenetta Hew 09/07/2017, 8:01 AM

## 2017-09-18 ENCOUNTER — Encounter: Payer: Self-pay | Admitting: Oncology

## 2017-09-19 ENCOUNTER — Inpatient Hospital Stay: Payer: Medicare HMO | Attending: Oncology | Admitting: Oncology

## 2017-09-19 ENCOUNTER — Ambulatory Visit
Admission: RE | Admit: 2017-09-19 | Discharge: 2017-09-19 | Disposition: A | Discharge status: Home / Self Care | Payer: Medicare HMO | Source: Ambulatory Visit | Attending: Oncology | Admitting: Oncology

## 2017-09-19 DIAGNOSIS — Z122 Encounter for screening for malignant neoplasm of respiratory organs: Secondary | ICD-10-CM

## 2017-09-19 DIAGNOSIS — J439 Emphysema, unspecified: Secondary | ICD-10-CM | POA: Diagnosis not present

## 2017-09-19 DIAGNOSIS — Z87891 Personal history of nicotine dependence: Secondary | ICD-10-CM

## 2017-09-19 DIAGNOSIS — I700 Atherosclerosis of aorta: Secondary | ICD-10-CM | POA: Insufficient documentation

## 2017-09-19 DIAGNOSIS — F17210 Nicotine dependence, cigarettes, uncomplicated: Secondary | ICD-10-CM | POA: Diagnosis not present

## 2017-09-19 NOTE — Progress Notes (Signed)
In accordance with CMS guidelines, patient has met eligibility criteria including age, absence of signs or symptoms of lung cancer.  Social History   Tobacco Use  . Smoking status: Current Every Day Smoker    Packs/day: 1.00    Years: 41.00    Pack years: 41.00    Types: Cigarettes  . Smokeless tobacco: Never Used  Substance Use Topics  . Alcohol use: Yes    Alcohol/week: 10.8 oz    Types: 18 Cans of beer per week  . Drug use: Never     A shared decision-making session was conducted prior to the performance of CT scan. This includes one or more decision aids, includes benefits and harms of screening, follow-up diagnostic testing, over-diagnosis, false positive rate, and total radiation exposure.  Counseling on the importance of adherence to annual lung cancer LDCT screening, impact of co-morbidities, and ability or willingness to undergo diagnosis and treatment is imperative for compliance of the program.  Counseling on the importance of continued smoking cessation for former smokers; the importance of smoking cessation for current smokers, and information about tobacco cessation interventions have been given to patient including [place] and 1800 quit [place] programs.  Written order for lung cancer screening with LDCT has been given to the patient and any and all questions have been answered to the best of my abilities.   Yearly follow up will be coordinated by Burgess Estelle, Thoracic Navigator.  Faythe Casa, NP 09/19/2017 9:28 AM

## 2017-09-21 ENCOUNTER — Encounter: Payer: Self-pay | Admitting: *Deleted

## 2018-08-24 ENCOUNTER — Encounter: Payer: Self-pay | Admitting: *Deleted

## 2018-08-29 ENCOUNTER — Encounter: Payer: Self-pay | Admitting: *Deleted

## 2018-10-25 ENCOUNTER — Telehealth: Payer: Self-pay | Admitting: *Deleted

## 2018-10-25 DIAGNOSIS — Z122 Encounter for screening for malignant neoplasm of respiratory organs: Secondary | ICD-10-CM

## 2018-10-25 DIAGNOSIS — Z87891 Personal history of nicotine dependence: Secondary | ICD-10-CM

## 2018-10-25 NOTE — Telephone Encounter (Signed)
Patient has been notified that annual lung cancer screening low dose CT scan is due currently or will be in near future. Confirmed that patient is within the age range of 55-77, and asymptomatic, (no signs or symptoms of lung cancer). Patient denies illness that would prevent curative treatment for lung cancer if found. Verified smoking history, (current, 42 pack year). The shared decision making visit was done 10/19/17. Patient is agreeable for CT scan being scheduled.

## 2018-11-04 ENCOUNTER — Other Ambulatory Visit: Payer: Self-pay

## 2018-11-04 ENCOUNTER — Ambulatory Visit
Admission: RE | Admit: 2018-11-04 | Discharge: 2018-11-04 | Disposition: A | Discharge status: Home / Self Care | Payer: Medicare HMO | Source: Ambulatory Visit | Attending: Nurse Practitioner | Admitting: Nurse Practitioner

## 2018-11-04 DIAGNOSIS — Z122 Encounter for screening for malignant neoplasm of respiratory organs: Secondary | ICD-10-CM

## 2018-11-04 DIAGNOSIS — Z87891 Personal history of nicotine dependence: Secondary | ICD-10-CM | POA: Diagnosis present

## 2018-11-06 ENCOUNTER — Encounter: Payer: Self-pay | Admitting: *Deleted

## 2019-01-09 ENCOUNTER — Other Ambulatory Visit: Payer: Self-pay | Admitting: Internal Medicine

## 2019-01-09 DIAGNOSIS — I2510 Atherosclerotic heart disease of native coronary artery without angina pectoris: Secondary | ICD-10-CM | POA: Insufficient documentation

## 2019-01-09 DIAGNOSIS — J42 Unspecified chronic bronchitis: Secondary | ICD-10-CM | POA: Insufficient documentation

## 2019-01-09 DIAGNOSIS — R198 Other specified symptoms and signs involving the digestive system and abdomen: Secondary | ICD-10-CM

## 2019-01-21 ENCOUNTER — Ambulatory Visit
Admission: RE | Admit: 2019-01-21 | Discharge: 2019-01-21 | Disposition: A | Discharge status: Home / Self Care | Payer: Medicare HMO | Source: Ambulatory Visit | Attending: Internal Medicine | Admitting: Internal Medicine

## 2019-01-21 ENCOUNTER — Other Ambulatory Visit: Payer: Self-pay

## 2019-01-21 DIAGNOSIS — R198 Other specified symptoms and signs involving the digestive system and abdomen: Secondary | ICD-10-CM

## 2019-02-07 ENCOUNTER — Other Ambulatory Visit: Payer: Self-pay | Admitting: *Deleted

## 2019-02-07 DIAGNOSIS — Z20822 Contact with and (suspected) exposure to covid-19: Secondary | ICD-10-CM

## 2019-02-08 LAB — NOVEL CORONAVIRUS, NAA: SARS-CoV-2, NAA: NOT DETECTED

## 2019-02-20 ENCOUNTER — Other Ambulatory Visit: Payer: Self-pay

## 2019-02-20 DIAGNOSIS — Z20822 Contact with and (suspected) exposure to covid-19: Secondary | ICD-10-CM

## 2019-02-21 LAB — NOVEL CORONAVIRUS, NAA: SARS-CoV-2, NAA: NOT DETECTED

## 2019-10-21 ENCOUNTER — Telehealth: Payer: Self-pay

## 2019-10-21 DIAGNOSIS — Z122 Encounter for screening for malignant neoplasm of respiratory organs: Secondary | ICD-10-CM

## 2019-10-21 DIAGNOSIS — Z87891 Personal history of nicotine dependence: Secondary | ICD-10-CM

## 2019-10-21 NOTE — Telephone Encounter (Signed)
Patient has been notified that the low dose lung cancer screening CT scan is due currently or will be in near future.  Confirmed that patient is within the appropriate age range and asymptomatic, (no signs or symptoms of lung cancer).  Patient denies illness that would prevent curative treatment for lung cancer if found.  Patient is agreeable for CT scan being scheduled.    Verified smoking history (current smoker, with 42 year 1 ppd history).   CT scan scheduled for 11/06/19 @ 2:00

## 2019-10-22 NOTE — Telephone Encounter (Signed)
Smoking history: current, 43 pack year 

## 2019-10-22 NOTE — Addendum Note (Signed)
Addended by: Jonne Ply on: 10/22/2019 01:59 PM   Modules accepted: Orders

## 2019-11-06 ENCOUNTER — Other Ambulatory Visit: Payer: Self-pay

## 2019-11-06 ENCOUNTER — Ambulatory Visit
Admission: RE | Admit: 2019-11-06 | Discharge: 2019-11-06 | Disposition: A | Discharge status: Home / Self Care | Payer: Medicare HMO | Source: Ambulatory Visit | Attending: Oncology | Admitting: Oncology

## 2019-11-06 DIAGNOSIS — Z122 Encounter for screening for malignant neoplasm of respiratory organs: Secondary | ICD-10-CM

## 2019-11-06 DIAGNOSIS — Z87891 Personal history of nicotine dependence: Secondary | ICD-10-CM | POA: Insufficient documentation

## 2019-11-12 ENCOUNTER — Encounter: Payer: Self-pay | Admitting: *Deleted

## 2020-11-10 ENCOUNTER — Telehealth: Payer: Self-pay

## 2020-11-10 NOTE — Telephone Encounter (Signed)
Patient is scheduled for annual lung cancer screening CT scan June 16th @ 8:00. Patient remember location but would like a reminder text a couple days before the appt.

## 2020-11-11 ENCOUNTER — Other Ambulatory Visit: Payer: Self-pay | Admitting: *Deleted

## 2020-11-11 DIAGNOSIS — Z87891 Personal history of nicotine dependence: Secondary | ICD-10-CM

## 2020-11-11 DIAGNOSIS — Z122 Encounter for screening for malignant neoplasm of respiratory organs: Secondary | ICD-10-CM

## 2020-11-11 NOTE — Progress Notes (Signed)
Contacted and scheduled for annual lung screening scan. Patient is a current smoker with a 44 pack year history.  

## 2020-11-17 ENCOUNTER — Encounter: Payer: Self-pay | Admitting: *Deleted

## 2020-11-18 ENCOUNTER — Ambulatory Visit: Admission: RE | Admit: 2020-11-18 | Payer: Medicare HMO | Source: Ambulatory Visit

## 2020-11-22 ENCOUNTER — Telehealth: Payer: Self-pay | Admitting: *Deleted

## 2020-11-22 NOTE — Telephone Encounter (Signed)
Received authorization for lung screening scan. Left voicemail in attempt to reschedule.

## 2020-11-29 ENCOUNTER — Ambulatory Visit
Admission: RE | Admit: 2020-11-29 | Discharge: 2020-11-29 | Disposition: A | Discharge status: Home / Self Care | Payer: Medicare HMO | Source: Ambulatory Visit | Attending: Nurse Practitioner | Admitting: Nurse Practitioner

## 2020-11-29 ENCOUNTER — Other Ambulatory Visit: Payer: Self-pay

## 2020-11-29 DIAGNOSIS — Z122 Encounter for screening for malignant neoplasm of respiratory organs: Secondary | ICD-10-CM

## 2020-11-29 DIAGNOSIS — Z87891 Personal history of nicotine dependence: Secondary | ICD-10-CM | POA: Diagnosis not present

## 2020-12-21 ENCOUNTER — Encounter: Payer: Self-pay | Admitting: *Deleted

## 2020-12-22 ENCOUNTER — Other Ambulatory Visit: Payer: Self-pay

## 2020-12-22 ENCOUNTER — Emergency Department: Payer: Medicare HMO

## 2020-12-22 ENCOUNTER — Emergency Department
Admission: EM | Admit: 2020-12-22 | Discharge: 2020-12-22 | Disposition: A | Discharge status: Home / Self Care | Payer: Medicare HMO | Attending: Emergency Medicine | Admitting: Emergency Medicine

## 2020-12-22 DIAGNOSIS — F17210 Nicotine dependence, cigarettes, uncomplicated: Secondary | ICD-10-CM | POA: Diagnosis not present

## 2020-12-22 DIAGNOSIS — J449 Chronic obstructive pulmonary disease, unspecified: Secondary | ICD-10-CM | POA: Diagnosis not present

## 2020-12-22 DIAGNOSIS — Z96641 Presence of right artificial hip joint: Secondary | ICD-10-CM | POA: Insufficient documentation

## 2020-12-22 DIAGNOSIS — R079 Chest pain, unspecified: Secondary | ICD-10-CM | POA: Diagnosis present

## 2020-12-22 LAB — BASIC METABOLIC PANEL
Anion gap: 9 (ref 5–15)
BUN: 21 mg/dL (ref 8–23)
CO2: 28 mmol/L (ref 22–32)
Calcium: 9.3 mg/dL (ref 8.9–10.3)
Chloride: 100 mmol/L (ref 98–111)
Creatinine, Ser: 1 mg/dL (ref 0.61–1.24)
GFR, Estimated: >60 mL/min (ref >60)
Glucose, Bld: 116 mg/dL — ABNORMAL HIGH (ref 70–99)
Potassium: 4.4 mmol/L (ref 3.5–5.1)
Sodium: 137 mmol/L (ref 135–145)

## 2020-12-22 LAB — CBC
HCT: 44.6 % (ref 39.0–52.0)
Hemoglobin: 14.5 g/dL (ref 13.0–17.0)
MCH: 30.3 pg (ref 26.0–34.0)
MCHC: 32.5 g/dL (ref 30.0–36.0)
MCV: 93.1 fL (ref 80.0–100.0)
Platelets: 179 10*3/uL (ref 150–400)
RBC: 4.79 MIL/uL (ref 4.22–5.81)
RDW: 14.4 % (ref 11.5–15.5)
WBC: 6.8 10*3/uL (ref 4.0–10.5)
nRBC: 0 % (ref 0.0–0.2)

## 2020-12-22 LAB — TROPONIN I (HIGH SENSITIVITY): Troponin I (High Sensitivity): 5 ng/L (ref <18)

## 2020-12-22 MED ORDER — PREDNISONE 50 MG PO TABS: 50.0000 mg | ORAL_TABLET | Freq: Every day | ORAL | 0 refills | Status: DC

## 2020-12-22 MED ORDER — IPRATROPIUM-ALBUTEROL 0.5-2.5 (3) MG/3ML IN SOLN
3.0000 mL | Freq: Once | RESPIRATORY_TRACT | Status: AC
  Administered 2020-12-22: 3 mL via RESPIRATORY_TRACT
  Filled 2020-12-22: qty 3

## 2020-12-22 MED ORDER — PREDNISONE 20 MG PO TABS
60.0000 mg | ORAL_TABLET | Freq: Once | ORAL | Status: AC
  Administered 2020-12-22: 60 mg via ORAL
  Filled 2020-12-22: qty 3

## 2020-12-22 MED ORDER — ALBUTEROL SULFATE HFA 108 (90 BASE) MCG/ACT IN AERS: 2.0000 | INHALATION_SPRAY | Freq: Four times a day (QID) | RESPIRATORY_TRACT | 2 refills | Status: AC | PRN

## 2020-12-22 NOTE — ED Triage Notes (Signed)
Pt here with recurrent CP. Pt states that when he bends over his CP comes on and last for about 15 mins. Pt states that pain has increased over the past month. Pt stable in triage.

## 2020-12-22 NOTE — ED Triage Notes (Signed)
C/O chest pain and SOB.  Has been a recurrent complaint, initially starting in 2020, symptoms returned again one month ago.  Patient is AAOx3.  Skin warm and dry. NAD.  No SOB/ DOE noted.

## 2020-12-22 NOTE — ED Provider Notes (Signed)
[facility] Emergency Department Provider Note   ____________________________________________    I have reviewed the triage vital signs and the nursing notes.   HISTORY  Chief Complaint Occasional chest tightness, wheezing    HPI Phillip Mendez is a 70 y.o. male who is been smoking for 40 years presents with complaints of occasional chest tightness in the lower chest especially when he bends over as well as wheezing and intermittent shortness of breath.  Currently he feels well reports his breathing is at baseline.  No chest pain at this time.  He notes over the last 6 months he has had wheezing that has been noticeable.  He has not been diagnosed with COPD.  He does smoke cigarettes.  Denies fevers chills or cough.  No calf pain or lower extremity swelling  Past Medical History:  Diagnosis Date   Arthritis    GERD (gastroesophageal reflux disease)     Patient Active Problem List   Diagnosis Date Noted   Post-traumatic osteoarthritis of right hip 09/04/2017    Past Surgical History:  Procedure Laterality Date   FRACTURE SURGERY Left    wrist   HERNIA REPAIR Bilateral    groin   MUSCLE REPAIR Right    arm and leg   TOTAL HIP ARTHROPLASTY Right 09/04/2017   Procedure: TOTAL HIP ARTHROPLASTY ANTERIOR APPROACH;  Surgeon: Kennedy Bucker, MD;  Location: [facility] ORS;  Service: Orthopedics;  Laterality: Right;    Prior to Admission medications   Medication Sig Start Date End Date Taking? Authorizing Provider  albuterol (VENTOLIN HFA) 108 (90 Base) MCG/ACT inhaler Inhale 2 puffs into the lungs every 6 (six) hours as needed for wheezing or shortness of breath. 12/22/20  Yes Jene Every, MD  predniSONE (DELTASONE) 50 MG tablet Take 1 tablet (50 mg total) by mouth daily with breakfast. 12/22/20  Yes Jene Every, MD  acetaminophen (TYLENOL) 325 MG tablet Take 1-2 tablets (325-650 mg total) by mouth every 6 (six) hours as needed for mild pain (pain score 1-3  or temp > 100.5). 09/06/17   Evon Slack, PA-C  calcium carbonate (TUMS - DOSED IN MG ELEMENTAL CALCIUM) 500 MG chewable tablet Chew 2 tablets by mouth daily as needed for indigestion or heartburn.    [provider]  sildenafil (REVATIO) 20 MG tablet Take 20 mg by mouth daily as needed for erectile dysfunction. 08/11/17   [provider]  terbinafine (LAMISIL) 250 MG tablet Take 500 mg by mouth daily.     [provider]     Allergies Patient has no known allergies.  No family history on file.  Social History Social History   Tobacco Use   Smoking status: Every Day    Packs/day: 1.00    Years: 41.00    Pack years: 41.00    Types: Cigarettes   Smokeless tobacco: Never  Vaping Use   Vaping Use: Never used  Substance Use Topics   Alcohol use: Yes    Alcohol/week: 18.0 standard drinks    Types: 18 Cans of beer per week   Drug use: Never    Review of Systems  Constitutional: No fever/chills Eyes: No visual changes.  ENT: No sore throat. Cardiovascular: As above Respiratory: As above Gastrointestinal: No abdominal pain.  No nausea, no vomiting.   Genitourinary: Negative for dysuria. Musculoskeletal: Negative for back pain. Skin: Negative for rash. Neurological: Negative for headaches or weakness   ____________________________________________   PHYSICAL EXAM:  VITAL SIGNS: ED Triage Vitals  Enc Vitals Group     BP 12/22/20 0951 (!) 150/97     Pulse Rate 12/22/20 0951 64     Resp 12/22/20 0951 18     Temp 12/22/20 0951 97.8 F (36.6 C)     Temp Source 12/22/20 0951 Oral     SpO2 12/22/20 0951 95 %     Weight 12/22/20 0948 89.8 kg (198 lb)     Height 12/22/20 0948 1.778 m (5\' 10" )     Head Circumference --      Peak Flow --      Pain Score 12/22/20 0948 6     Pain Loc --      Pain Edu? --      Excl. in GC? --     Constitutional: Alert and oriented.   Nose: No congestion/rhinnorhea. Mouth/Throat: Mucous membranes are moist.     Cardiovascular: Normal rate, regular rhythm. Grossly normal heart sounds.  Good peripheral circulation. Respiratory: Normal respiratory effort.  No retractions.  Bilateral scattered wheezes Gastrointestinal: Soft and nontender. No distention.  No CVA tenderness.  Musculoskeletal: No lower extremity tenderness nor edema.  Warm and well perfused Neurologic:  Normal speech and language. No gross focal neurologic deficits are appreciated.  Skin:  Skin is warm, dry and intact. No rash noted. Psychiatric: Mood and affect are normal. Speech and behavior are normal.  ____________________________________________   LABS (all labs ordered are listed, but only abnormal results are displayed)  Labs Reviewed  BASIC METABOLIC PANEL - Abnormal; Notable for the following components:      Result Value   Glucose, Bld 116 (*)    All other components within normal limits  CBC  TROPONIN I (HIGH SENSITIVITY)   ____________________________________________  EKG  ED ECG REPORT I, 12/24/20, the attending physician, personally viewed and interpreted this ECG.  Date: 12/22/2020  Rhythm: normal sinus rhythm QRS Axis: normal Intervals: Right bundle branch block ST/T Wave abnormalities: normal Narrative Interpretation: no evidence of acute ischemia  ____________________________________________  RADIOLOGY  Chest x-ray viewed by me, consistent with COPD ____________________________________________   PROCEDURES  Procedure(s) performed: No  Procedures   Critical Care performed: No ____________________________________________   INITIAL IMPRESSION / ASSESSMENT AND PLAN / ED COURSE  Pertinent labs & imaging results that were available during my care of the patient were reviewed by me and considered in my medical decision making (see chart for details).   Patient presents with 6 months of wheezing, occasional chest tightness, asymptomatic at this time.  Wheezing noted on exam, has been  smoking for 40 years.  Strongly suspect COPD as a cause of his symptoms.  EKG troponin are reassuring, not consistent with ACS dissection or PE.  No pneumonia on chest x-ray, lab work otherwise unremarkable.  Will treat with duo nebs and prednisone and reevaluate.  Patient significantly improved after treatment, will need follow-up with pulmonology for further evaluation    ____________________________________________   FINAL CLINICAL IMPRESSION(S) / ED DIAGNOSES  Final diagnoses:  Chronic obstructive pulmonary disease, unspecified COPD type (HCC)        Note:  This document was prepared using Dragon voice recognition software and may include unintentional dictation errors.    12/24/2020, MD 12/22/20 1245

## 2020-12-22 NOTE — ED Notes (Signed)
See triage note  Presents with some chest pain and SOB  States he has had these sx's for the past couple of years  But this episode started 2 days ago   Denies nay fever or cough

## 2021-03-28 DIAGNOSIS — E118 Type 2 diabetes mellitus with unspecified complications: Secondary | ICD-10-CM | POA: Insufficient documentation

## 2021-09-29 ENCOUNTER — Other Ambulatory Visit: Payer: Self-pay | Admitting: Internal Medicine

## 2021-09-29 DIAGNOSIS — E118 Type 2 diabetes mellitus with unspecified complications: Secondary | ICD-10-CM

## 2021-09-29 DIAGNOSIS — F17210 Nicotine dependence, cigarettes, uncomplicated: Secondary | ICD-10-CM

## 2021-10-05 ENCOUNTER — Ambulatory Visit
Admission: RE | Admit: 2021-10-05 | Discharge: 2021-10-05 | Disposition: A | Discharge status: Home / Self Care | Payer: Medicare HMO | Source: Ambulatory Visit | Attending: Internal Medicine | Admitting: Internal Medicine

## 2021-10-05 DIAGNOSIS — E118 Type 2 diabetes mellitus with unspecified complications: Secondary | ICD-10-CM

## 2021-10-05 DIAGNOSIS — F17210 Nicotine dependence, cigarettes, uncomplicated: Secondary | ICD-10-CM

## 2021-11-29 ENCOUNTER — Ambulatory Visit (INDEPENDENT_AMBULATORY_CARE_PROVIDER_SITE_OTHER): Payer: Medicare HMO | Admitting: Vascular Surgery

## 2021-11-29 ENCOUNTER — Encounter (INDEPENDENT_AMBULATORY_CARE_PROVIDER_SITE_OTHER): Payer: Self-pay | Admitting: Vascular Surgery

## 2021-11-29 DIAGNOSIS — F17200 Nicotine dependence, unspecified, uncomplicated: Secondary | ICD-10-CM | POA: Diagnosis not present

## 2021-11-29 DIAGNOSIS — I70219 Atherosclerosis of native arteries of extremities with intermittent claudication, unspecified extremity: Secondary | ICD-10-CM | POA: Insufficient documentation

## 2021-11-29 DIAGNOSIS — I10 Essential (primary) hypertension: Secondary | ICD-10-CM | POA: Diagnosis not present

## 2021-11-29 DIAGNOSIS — I70213 Atherosclerosis of native arteries of extremities with intermittent claudication, bilateral legs: Secondary | ICD-10-CM

## 2021-11-29 NOTE — Assessment & Plan Note (Signed)
blood pressure control important in reducing the progression of atherosclerotic disease. On appropriate oral medications.  

## 2021-11-29 NOTE — Assessment & Plan Note (Signed)
We had a discussion for approximately 4 minutes regarding the absolute need for smoking cessation due to the deleterious nature of tobacco on the vascular system. We discussed the tobacco use would diminish patency of any intervention, and likely significantly worsen progressio of disease. We discussed multiple agents for quitting including replacement therapy or medications to reduce cravings such as Chantix. The patient voices their understanding of the importance of smoking cessation.  

## 2022-02-27 ENCOUNTER — Other Ambulatory Visit (INDEPENDENT_AMBULATORY_CARE_PROVIDER_SITE_OTHER): Payer: Self-pay | Admitting: Vascular Surgery

## 2022-02-27 DIAGNOSIS — I70213 Atherosclerosis of native arteries of extremities with intermittent claudication, bilateral legs: Secondary | ICD-10-CM

## 2022-02-28 ENCOUNTER — Encounter (INDEPENDENT_AMBULATORY_CARE_PROVIDER_SITE_OTHER): Payer: Self-pay | Admitting: Vascular Surgery

## 2022-02-28 ENCOUNTER — Ambulatory Visit (INDEPENDENT_AMBULATORY_CARE_PROVIDER_SITE_OTHER): Payer: Medicare HMO | Admitting: Vascular Surgery

## 2022-02-28 ENCOUNTER — Ambulatory Visit (INDEPENDENT_AMBULATORY_CARE_PROVIDER_SITE_OTHER): Payer: Medicare HMO

## 2022-02-28 VITALS — BP 142/74 | HR 66 | Resp 16 | Wt 203.0 lb

## 2022-02-28 DIAGNOSIS — I70213 Atherosclerosis of native arteries of extremities with intermittent claudication, bilateral legs: Secondary | ICD-10-CM

## 2022-02-28 DIAGNOSIS — F17200 Nicotine dependence, unspecified, uncomplicated: Secondary | ICD-10-CM | POA: Diagnosis not present

## 2022-02-28 DIAGNOSIS — I10 Essential (primary) hypertension: Secondary | ICD-10-CM

## 2022-02-28 DIAGNOSIS — E118 Type 2 diabetes mellitus with unspecified complications: Secondary | ICD-10-CM

## 2022-02-28 NOTE — Assessment & Plan Note (Signed)
blood glucose control important in reducing the progression of atherosclerotic disease. Also, involved in wound healing. On appropriate medications.  

## 2022-02-28 NOTE — Assessment & Plan Note (Signed)
His ABIs today are about the same at 0.59 on the right and 0.79 on the left.  We also did duplex which showed calcified irregular plaque in the right common femoral artery with monophasic flow already at that level and monophasic flow throughout the remainder of the right lower extremity.  The left common femoral artery also demonstrated significant calcific plaque and was biphasic with monophasic flow throughout the SFA and distally. We again had a long discussion today about the pathophysiology and natural history of atherosclerotic peripheral arterial disease.  He clearly has pretty disabling claudication symptoms but does not want to have anything invasive done which is certainly his option as he does not have limb threatening symptoms.  Based on the duplex findings, an angiogram would still be recommended but I suspect he will need some sort of hybrid procedure with femoral endarterectomies and likely concomitant aortoiliac or infrainguinal disease as well if we do a procedure so this will not be just a minor procedure.  The patient voices understanding.  He is going to attempt lifestyle modifications and I will see him back in 6 months.

## 2022-02-28 NOTE — Progress Notes (Signed)
MRN : [id number]  Phillip Mendez is a 71 y.o. (06/05/1951) male who presents with chief complaint of  Chief Complaint  Patient presents with   Follow-up    Ultrasound follow up  .  History of Present Illness: Patient returns today in follow up of his PAD.  He says his claudication symptoms may have gotten a little worse.  His right leg bothers him more than the left.  He continues to smoke.  He does not have rest pain or ulceration.  His ABIs today are about the same at 0.59 on the right and 0.79 on the left.  We also did duplex which showed calcified irregular plaque in the right common femoral artery with monophasic flow already at that level and monophasic flow throughout the remainder of the right lower extremity.  The left common femoral artery also demonstrated significant calcific plaque and was biphasic with monophasic flow throughout the SFA and distally.  Current Outpatient Medications  Medication Sig Dispense Refill   acetaminophen (TYLENOL) 325 MG tablet Take 1-2 tablets (325-650 mg total) by mouth every 6 (six) hours as needed for mild pain (pain score 1-3 or temp > 100.5).     albuterol (VENTOLIN HFA) 108 (90 Base) MCG/ACT inhaler Inhale 2 puffs into the lungs every 6 (six) hours as needed for wheezing or shortness of breath. 8 g 2   amLODipine (NORVASC) 5 MG tablet Take 5 mg by mouth daily.     INCRUSE ELLIPTA 62.5 MCG/ACT AEPB Inhale 1 puff into the lungs daily.     losartan-hydrochlorothiazide (HYZAAR) 100-12.5 MG tablet Take 1 tablet by mouth daily.     rosuvastatin (CRESTOR) 20 MG tablet Take 20 mg by mouth daily.     calcium carbonate (TUMS - DOSED IN MG ELEMENTAL CALCIUM) 500 MG chewable tablet Chew 2 tablets by mouth daily as needed for indigestion or heartburn. (Patient not taking: Reported on 11/29/2021)     predniSONE (DELTASONE) 50 MG tablet Take 1 tablet (50 mg total) by mouth daily with breakfast. (Patient not taking: Reported on 11/29/2021) 4 tablet 0   sildenafil  (REVATIO) 20 MG tablet Take 20 mg by mouth daily as needed for erectile dysfunction. (Patient not taking: Reported on 11/29/2021)  2   terbinafine (LAMISIL) 250 MG tablet Take 500 mg by mouth daily.  (Patient not taking: Reported on 11/29/2021)     No current facility-administered medications for this visit.    Past Medical History:  Diagnosis Date   Arthritis    GERD (gastroesophageal reflux disease)     Past Surgical History:  Procedure Laterality Date   FRACTURE SURGERY Left    wrist   HERNIA REPAIR Bilateral    groin   MUSCLE REPAIR Right    arm and leg   TOTAL HIP ARTHROPLASTY Right 09/04/2017   Procedure: TOTAL HIP ARTHROPLASTY ANTERIOR APPROACH;  Surgeon: Kennedy Bucker, MD;  Location: [facility] ORS;  Service: Orthopedics;  Laterality: Right;     Social History   Tobacco Use   Smoking status: Every Day    Packs/day: 1.00    Years: 41.00    Total pack years: 41.00    Types: Cigarettes   Smokeless tobacco: Never  Vaping Use   Vaping Use: Never used  Substance Use Topics   Alcohol use: Yes    Alcohol/week: 18.0 standard drinks of alcohol    Types: 18 Cans of beer per week   Drug use: Never      Family History  Problem Relation  Age of Onset   Cancer Brother   No bleeding or clotting disorders No aneurysms  No Known Allergies  REVIEW OF SYSTEMS (Negative unless checked)   Constitutional: [] Weight loss  [] Fever  [] Chills Cardiac: [] Chest pain   [] Chest pressure   [] Palpitations   [] Shortness of breath when laying flat   [] Shortness of breath at rest   [] Shortness of breath with exertion. Vascular:  [x] Pain in legs with walking   [] Pain in legs at rest   [] Pain in legs when laying flat   [x] Claudication   [] Pain in feet when walking  [] Pain in feet at rest  [] Pain in feet when laying flat   [] History of DVT   [] Phlebitis   [] Swelling in legs   [] Varicose veins   [] Non-healing ulcers Pulmonary:   [] Uses home oxygen   [] Productive cough   [] Hemoptysis   [] Wheeze  [] COPD    [] Asthma Neurologic:  [] Dizziness  [] Blackouts   [] Seizures   [] History of stroke   [] History of TIA  [] Aphasia   [] Temporary blindness   [] Dysphagia   [] Weakness or numbness in arms   [] Weakness or numbness in legs Musculoskeletal:  [x] Arthritis   [] Joint swelling   [x] Joint pain   [] Low back pain Hematologic:  [] Easy bruising  [] Easy bleeding   [] Hypercoagulable state   [] Anemic  [] Hepatitis Gastrointestinal:  [] Blood in stool   [] Vomiting blood  [x] Gastroesophageal reflux/heartburn   [] Abdominal pain Genitourinary:  [] Chronic kidney disease   [] Difficult urination  [] Frequent urination  [] Burning with urination   [] Hematuria Skin:  [] Rashes   [] Ulcers   [] Wounds Psychological:  [] History of anxiety   []  History of major depression.    Physical Examination  BP (!) 142/74 (BP Location: Left Arm)   Pulse 66   Resp 16   Wt 203 lb (92.1 kg)   BMI 29.13 kg/m  Gen:  WD/WN, NAD Head: [place]/AT, No temporalis wasting. Ear/Nose/Throat: Hearing grossly intact, nares w/o erythema or drainage Eyes: Conjunctiva clear. Sclera non-icteric Neck: Supple.  Trachea midline Pulmonary:  Good air movement, no use of accessory muscles.  Cardiac: RRR, no JVD Vascular:  Vessel Right Left  Radial Palpable Palpable                          PT Not palpable 1+ palpable  DP 1+ palpable 1+ palpable   Gastrointestinal: soft, non-tender/non-distended. No guarding/reflex.  Musculoskeletal: M/S 5/5 throughout.  No deformity or atrophy.  No edema. Neurologic: Sensation grossly intact in extremities.  Symmetrical.  Speech is fluent.  Psychiatric: Judgment intact, Mood & affect appropriate for pt's clinical situation. Dermatologic: No rashes or ulcers noted.  No cellulitis or open wounds.      Labs No results found for this or any previous visit (from the past 2160 hour(s)).  Radiology No results found.  Assessment/Plan Essential hypertension blood pressure control important in reducing the  progression of atherosclerotic disease. On appropriate oral medications.   Tobacco use disorder We had a discussion for approximately 4 minutes regarding the absolute need for smoking cessation due to the deleterious nature of tobacco on the vascular system. We discussed the tobacco use would diminish patency of any intervention, and likely significantly worsen progressio of disease. We discussed multiple agents for quitting including replacement therapy or medications to reduce cravings such as Chantix. The patient voices their understanding of the importance of smoking cessation.  Diabetes mellitus type 2 with complications (HCC) blood glucose control important in reducing the  progression of atherosclerotic disease. Also, involved in wound healing. On appropriate medications.   Atherosclerosis of native arteries of extremity with intermittent claudication (HCC) His ABIs today are about the same at 0.59 on the right and 0.79 on the left.  We also did duplex which showed calcified irregular plaque in the right common femoral artery with monophasic flow already at that level and monophasic flow throughout the remainder of the right lower extremity.  The left common femoral artery also demonstrated significant calcific plaque and was biphasic with monophasic flow throughout the SFA and distally. We again had a long discussion today about the pathophysiology and natural history of atherosclerotic peripheral arterial disease.  He clearly has pretty disabling claudication symptoms but does not want to have anything invasive done which is certainly his option as he does not have limb threatening symptoms.  Based on the duplex findings, an angiogram would still be recommended but I suspect he will need some sort of hybrid procedure with femoral endarterectomies and likely concomitant aortoiliac or infrainguinal disease as well if we do a procedure so this will not be just a minor procedure.  The patient voices  understanding.  He is going to attempt lifestyle modifications and I will see him back in 6 months.    Festus Barren, MD  02/28/2022 10:00 AM    This note was created with Dragon medical transcription system.  Any errors from dictation are purely unintentional

## 2022-04-03 ENCOUNTER — Encounter (INDEPENDENT_AMBULATORY_CARE_PROVIDER_SITE_OTHER): Payer: Self-pay

## 2022-08-30 ENCOUNTER — Ambulatory Visit (INDEPENDENT_AMBULATORY_CARE_PROVIDER_SITE_OTHER): Payer: Medicare HMO

## 2022-08-30 ENCOUNTER — Encounter (INDEPENDENT_AMBULATORY_CARE_PROVIDER_SITE_OTHER): Payer: Self-pay | Admitting: Nurse Practitioner

## 2022-08-30 ENCOUNTER — Ambulatory Visit (INDEPENDENT_AMBULATORY_CARE_PROVIDER_SITE_OTHER): Payer: Medicare HMO | Admitting: Nurse Practitioner

## 2022-08-30 VITALS — BP 128/77 | HR 69 | Resp 18 | Ht 70.0 in | Wt 210.2 lb

## 2022-08-30 DIAGNOSIS — I70213 Atherosclerosis of native arteries of extremities with intermittent claudication, bilateral legs: Secondary | ICD-10-CM | POA: Diagnosis not present

## 2022-08-30 DIAGNOSIS — I10 Essential (primary) hypertension: Secondary | ICD-10-CM

## 2022-08-30 DIAGNOSIS — E118 Type 2 diabetes mellitus with unspecified complications: Secondary | ICD-10-CM

## 2022-08-30 DIAGNOSIS — F17200 Nicotine dependence, unspecified, uncomplicated: Secondary | ICD-10-CM

## 2022-08-30 LAB — VAS US ABI WITH/WO TBI
Left ABI: 0.63
Right ABI: 0.58

## 2022-09-19 ENCOUNTER — Encounter (INDEPENDENT_AMBULATORY_CARE_PROVIDER_SITE_OTHER): Payer: Self-pay | Admitting: Nurse Practitioner

## 2022-09-19 NOTE — Progress Notes (Signed)
Subjective:    Patient ID: Phillip Mendez, male    DOB: [birth year], 72 y.o.   MRN: [id number] Chief Complaint  Patient presents with   Follow-up    f/u in 6 months with abi    Patient returns today in follow up of his PAD.  He says his claudication symptoms may have gotten a little worse.  His right leg bothers him more than the left.  He continues to smoke.  He does not have rest pain or ulceration.  His ABIs today are 0.58 on the right and 0.63 on the left.  These are slightly more dampened on the left.  He also does have some notable arthralgias.  He has monophasic tibial artery waveforms bilaterally.    Review of Systems  Cardiovascular:        Claudication  Musculoskeletal:  Positive for arthralgias.  All other systems reviewed and are negative.      Objective:   Physical Exam Vitals reviewed.  HENT:     Head: Normocephalic.  Cardiovascular:     Rate and Rhythm: Normal rate.     Pulses:          Dorsalis pedis pulses are detected w/ Doppler on the right side and detected w/ Doppler on the left side.       Posterior tibial pulses are detected w/ Doppler on the right side and detected w/ Doppler on the left side.  Pulmonary:     Effort: Pulmonary effort is normal.  Skin:    General: Skin is warm and dry.  Neurological:     Mental Status: He is alert and oriented to person, place, and time.  Psychiatric:        Mood and Affect: Mood normal.        Behavior: Behavior normal.        Thought Content: Thought content normal.        Judgment: Judgment normal.     BP 128/77 (BP Location: Right Arm)   Pulse 69   Resp 18   Ht  (1.778 m)   Wt 210 lb 3.2 oz (95.3 kg)   BMI 30.16 kg/m   Past Medical History:  Diagnosis Date   Arthritis    GERD (gastroesophageal reflux disease)     Social History   Socioeconomic History   Marital status: Married    Spouse name: Not on file   Number of children: Not on file   Years of education: Not on file   Highest  education level: Not on file  Occupational History   Not on file  Tobacco Use   Smoking status: Every Day    Packs/day: 1.00    Years: 41.00    Additional pack years: 0.00    Total pack years: 41.00    Types: Cigarettes   Smokeless tobacco: Never  Vaping Use   Vaping Use: Never used  Substance and Sexual Activity   Alcohol use: Yes    Alcohol/week: 18.0 standard drinks of alcohol    Types: 18 Cans of beer per week   Drug use: Never   Sexual activity: Not on file  Other Topics Concern   Not on file  Social History Narrative   Not on file   Social Determinants of Health   Financial Resource Strain: Not on file  Food Insecurity: Not on file  Transportation Needs: Not on file  Physical Activity: Not on file  Stress: Not on file  Social Connections: Not on file  Intimate  Partner Violence: Not on file    Past Surgical History:  Procedure Laterality Date   FRACTURE SURGERY Left    wrist   HERNIA REPAIR Bilateral    groin   MUSCLE REPAIR Right    arm and leg   TOTAL HIP ARTHROPLASTY Right 09/04/2017   Procedure: TOTAL HIP ARTHROPLASTY ANTERIOR APPROACH;  Surgeon: Kennedy Bucker, MD;  Location: [facility] ORS;  Service: Orthopedics;  Laterality: Right;    Family History  Problem Relation Age of Onset   Cancer Brother     No Known Allergies     Latest Ref Rng & Units 12/22/2020    9:56 AM 09/06/2017    4:58 AM 09/04/2017    9:33 PM  CBC  WBC 4.0 - 10.5 K/uL 6.8  9.0  11.7   Hemoglobin 13.0 - 17.0 g/dL 16.1  09.6  04.5   Hematocrit 39.0 - 52.0 % 44.6  39.2  41.0   Platelets 150 - 400 K/uL 179  177  192       CMP     Component Value Date/Time   NA 137 12/22/2020 0956   K 4.4 12/22/2020 0956   CL 100 12/22/2020 0956   CO2 28 12/22/2020 0956   GLUCOSE 116 (H) 12/22/2020 0956   BUN 21 12/22/2020 0956   CREATININE 1.00 12/22/2020 0956   CALCIUM 9.3 12/22/2020 0956   GFRNONAA >60 12/22/2020 0956   GFRAA >60 09/06/2017 0458     VAS [place] ABI WITH/WO TBI  Result  Date: 08/30/2022  LOWER EXTREMITY DOPPLER STUDY Patient Name:  Phillip Mendez  Date of Exam:   08/30/2022 Medical Rec #: [id number]       Accession #:    [id number] Date of Birth: Oct 19, 1950       Patient Gender: M Patient Age:   35 years Exam Location:  [facility] Procedure:      VAS [place] ABI WITH/WO TBI Referring Phys: Festus Barren --------------------------------------------------------------------------------  Indications: Peripheral artery disease. Abnormal ABIs  Comparison Study: 02/28/2022 Performing Technologist: Debbe Bales RVS  Examination Guidelines: A complete evaluation includes at minimum, Doppler waveform signals and systolic blood pressure reading at the level of bilateral brachial, anterior tibial, and posterior tibial arteries, when vessel segments are accessible. Bilateral testing is considered an integral part of a complete examination. Photoelectric Plethysmograph (PPG) waveforms and toe systolic pressure readings are included as required and additional duplex testing as needed. Limited examinations for reoccurring indications may be performed as noted.  ABI Findings: +---------+------------------+-----+--------+--------+ Right    Rt Pressure (mmHg)IndexWaveformComment  +---------+------------------+-----+--------+--------+ Brachial 139                                     +---------+------------------+-----+--------+--------+ ATA      69                0.50                  +---------+------------------+-----+--------+--------+ PTA      80                0.58                  +---------+------------------+-----+--------+--------+ Great Toe30                0.22                  +---------+------------------+-----+--------+--------+ +---------+------------------+-----+--------+-------+ Left  Lt Pressure (mmHg)IndexWaveformComment +---------+------------------+-----+--------+-------+ Brachial 129                                     +---------+------------------+-----+--------+-------+ ATA      87                0.63                 +---------+------------------+-----+--------+-------+ PTA      80                0.58                 +---------+------------------+-----+--------+-------+ Great Toe41                0.29                 +---------+------------------+-----+--------+-------+ +-------+-----------+-----------+------------+------------+ ABI/TBIToday's ABIToday's TBIPrevious ABIPrevious TBI +-------+-----------+-----------+------------+------------+ Right  .58        .22        .59         .44          +-------+-----------+-----------+------------+------------+ Left   .63        .29        .79         .69          +-------+-----------+-----------+------------+------------+ Bilateral TBIs appear decreased compared to prior study on 02/28/2022. Right ABIs appear essentially unchanged compared to prior study on 02/28/2022. Lt ABIs appear to be decreased compared to prior study on 02/28/2022.  Summary: Right: Resting right ankle-brachial index indicates moderate right lower extremity arterial disease. The right toe-brachial index is abnormal. Left: Resting left ankle-brachial index indicates moderate left lower extremity arterial disease. The left toe-brachial index is abnormal. *See table(s) above for measurements and observations.   Electronically signed by Festus Barren MD on 08/30/2022 at 4:29:08 PM.    Final        Assessment & Plan:   1. Atherosclerosis of native artery of both lower extremities with intermittent claudication (HCC) Today the patient continues to have symptoms of claudication but he notes that at this time he is not ready to move forward with any intervention.  This continues to be an option as the patient currently does not have rest pain or any limb threatening symptoms.  We discussed with the symptoms look like and if he begins to experience these he should contact us as soon as  possible.  Based on his duplex and angiogram would be recommended but is possible he may require surgical intervention.  We will continue to maintain follow-up in 6 months and patient is advised to follow-up if his symptoms begin to worsen or if he begins to develop discoloration open wounds or ulcerations.  2. Diabetes mellitus type 2 with complications (HCC) Continue hypoglycemic medications as already ordered, these medications have been reviewed and there are no changes at this time.  Hgb A1C to be monitored as already arranged by primary service  3. Tobacco use disorder Smoking cessation was discussed, 3-10 minutes spent on this topic specifically  4. Essential hypertension Continue antihypertensive medications as already ordered, these medications have been reviewed and there are no changes at this time.   Current Outpatient Medications on File Prior to Visit  Medication Sig Dispense Refill   acetaminophen (TYLENOL) 325 MG tablet Take 1-2 tablets (325-650 mg total) by mouth every 6 (six) hours as needed for  mild pain (pain score 1-3 or temp > 100.5).     albuterol (VENTOLIN HFA) 108 (90 Base) MCG/ACT inhaler Inhale 2 puffs into the lungs every 6 (six) hours as needed for wheezing or shortness of breath. 8 g 2   amLODipine (NORVASC) 5 MG tablet Take 5 mg by mouth daily.     calcium carbonate (TUMS - DOSED IN MG ELEMENTAL CALCIUM) 500 MG chewable tablet Chew 2 tablets by mouth daily as needed for indigestion or heartburn.     INCRUSE ELLIPTA 62.5 MCG/ACT AEPB Inhale 1 puff into the lungs daily.     losartan-hydrochlorothiazide (HYZAAR) 100-12.5 MG tablet Take 1 tablet by mouth daily.     rosuvastatin (CRESTOR) 20 MG tablet Take 20 mg by mouth daily.     predniSONE (DELTASONE) 50 MG tablet Take 1 tablet (50 mg total) by mouth daily with breakfast. (Patient not taking: Reported on 11/29/2021) 4 tablet 0   sildenafil (REVATIO) 20 MG tablet Take 20 mg by mouth daily as needed for erectile  dysfunction. (Patient not taking: Reported on 11/29/2021)  2   terbinafine (LAMISIL) 250 MG tablet Take 500 mg by mouth daily.  (Patient not taking: Reported on 11/29/2021)     No current facility-administered medications on file prior to visit.    There are no Patient Instructions on file for this visit. No follow-ups on file.   Georgiana Spinner, NP

## 2023-02-15 ENCOUNTER — Other Ambulatory Visit (INDEPENDENT_AMBULATORY_CARE_PROVIDER_SITE_OTHER): Payer: Self-pay | Admitting: Nurse Practitioner

## 2023-02-15 DIAGNOSIS — I70213 Atherosclerosis of native arteries of extremities with intermittent claudication, bilateral legs: Secondary | ICD-10-CM

## 2023-02-27 ENCOUNTER — Ambulatory Visit (INDEPENDENT_AMBULATORY_CARE_PROVIDER_SITE_OTHER): Payer: Medicare HMO

## 2023-02-27 ENCOUNTER — Ambulatory Visit (INDEPENDENT_AMBULATORY_CARE_PROVIDER_SITE_OTHER): Payer: Medicare HMO | Admitting: Vascular Surgery

## 2023-02-27 ENCOUNTER — Encounter (INDEPENDENT_AMBULATORY_CARE_PROVIDER_SITE_OTHER): Payer: Self-pay | Admitting: Vascular Surgery

## 2023-02-27 VITALS — BP 124/71 | HR 67 | Resp 16 | Wt 209.0 lb

## 2023-02-27 DIAGNOSIS — I70213 Atherosclerosis of native arteries of extremities with intermittent claudication, bilateral legs: Secondary | ICD-10-CM | POA: Diagnosis not present

## 2023-02-27 DIAGNOSIS — E118 Type 2 diabetes mellitus with unspecified complications: Secondary | ICD-10-CM | POA: Diagnosis not present

## 2023-02-27 DIAGNOSIS — I10 Essential (primary) hypertension: Secondary | ICD-10-CM

## 2023-02-27 DIAGNOSIS — I70221 Atherosclerosis of native arteries of extremities with rest pain, right leg: Secondary | ICD-10-CM | POA: Diagnosis not present

## 2023-02-27 DIAGNOSIS — I70229 Atherosclerosis of native arteries of extremities with rest pain, unspecified extremity: Secondary | ICD-10-CM | POA: Insufficient documentation

## 2023-02-27 NOTE — H&P (View-Only) (Signed)
MRN : [id number]  Phillip Mendez is a 72 y.o. (July 29, 1950) male who presents with chief complaint of  Chief Complaint  Patient presents with   Follow-up    Ultrasound follow up  .  History of Present Illness: Patient returns today in follow up of his PAD. His right leg claudication has gotten significantly worse he is now having pain to wake him at night from his right foot.  He can walk only about 30 yards before having to stop and he has become disabled from leg pain.  He is also now having more numbness and tingling.  He tried to go to a football game last weekend and could not walk with his friends to his seats and was extremely debilitated.  Noninvasive studies today demonstrate roughly stable ABIs in the 0.6 range on the right and 0.8 range on the left, but his digit pressures and waveforms remain markedly reduced on the right.  Current Outpatient Medications  Medication Sig Dispense Refill   acetaminophen (TYLENOL) 325 MG tablet Take 1-2 tablets (325-650 mg total) by mouth every 6 (six) hours as needed for mild pain (pain score 1-3 or temp > 100.5).     albuterol (VENTOLIN HFA) 108 (90 Base) MCG/ACT inhaler Inhale 2 puffs into the lungs every 6 (six) hours as needed for wheezing or shortness of breath. 8 g 2   amLODipine (NORVASC) 5 MG tablet Take 5 mg by mouth daily.     aspirin EC 81 MG tablet Take 81 mg by mouth daily. Swallow whole.     calcium carbonate (TUMS - DOSED IN MG ELEMENTAL CALCIUM) 500 MG chewable tablet Chew 2 tablets by mouth daily as needed for indigestion or heartburn.     INCRUSE ELLIPTA 62.5 MCG/ACT AEPB Inhale 1 puff into the lungs daily.     losartan-hydrochlorothiazide (HYZAAR) 100-12.5 MG tablet Take 1 tablet by mouth daily.     rosuvastatin (CRESTOR) 20 MG tablet Take 20 mg by mouth daily.     predniSONE (DELTASONE) 50 MG tablet Take 1 tablet (50 mg total) by mouth daily with breakfast. (Patient not taking: Reported on 11/29/2021) 4 tablet 0   sildenafil  (REVATIO) 20 MG tablet Take 20 mg by mouth daily as needed for erectile dysfunction. (Patient not taking: Reported on 11/29/2021)  2   terbinafine (LAMISIL) 250 MG tablet Take 500 mg by mouth daily.  (Patient not taking: Reported on 11/29/2021)     No current facility-administered medications for this visit.    Past Medical History:  Diagnosis Date   Arthritis    GERD (gastroesophageal reflux disease)     Past Surgical History:  Procedure Laterality Date   FRACTURE SURGERY Left    wrist   HERNIA REPAIR Bilateral    groin   MUSCLE REPAIR Right    arm and leg   TOTAL HIP ARTHROPLASTY Right 09/04/2017   Procedure: TOTAL HIP ARTHROPLASTY ANTERIOR APPROACH;  Surgeon: Kennedy Bucker, MD;  Location: [facility] ORS;  Service: Orthopedics;  Laterality: Right;     Social History   Tobacco Use   Smoking status: Every Day    Current packs/day: 1.00    Average packs/day: 1 pack/day for 41.0 years (41.0 ttl pk-yrs)    Types: Cigarettes   Smokeless tobacco: Never  Vaping Use   Vaping status: Never Used  Substance Use Topics   Alcohol use: Yes    Alcohol/week: 18.0 standard drinks of alcohol    Types: 18 Cans of beer per week  Drug use: Never       Family History  Problem Relation Age of Onset   Cancer Brother   No bleeding or clotting disorders No aneurysms  No Known Allergies   REVIEW OF SYSTEMS (Negative unless checked)  Constitutional: [] Weight loss  [] Fever  [] Chills Cardiac: [] Chest pain   [] Chest pressure   [] Palpitations   [] Shortness of breath when laying flat   [] Shortness of breath at rest   [] Shortness of breath with exertion. Vascular:  [x] Pain in legs with walking   [] Pain in legs at rest   [] Pain in legs when laying flat   [x] Claudication   [] Pain in feet when walking  [x] Pain in feet at rest  [] Pain in feet when laying flat   [] History of DVT   [] Phlebitis   [] Swelling in legs   [] Varicose veins   [] Non-healing ulcers Pulmonary:   [] Uses home oxygen   [] Productive  cough   [] Hemoptysis   [] Wheeze  [] COPD   [] Asthma Neurologic:  [] Dizziness  [] Blackouts   [] Seizures   [] History of stroke   [] History of TIA  [] Aphasia   [] Temporary blindness   [] Dysphagia   [] Weakness or numbness in arms   [] Weakness or numbness in legs Musculoskeletal:  [x] Arthritis   [] Joint swelling   [] Joint pain   [] Low back pain Hematologic:  [] Easy bruising  [] Easy bleeding   [] Hypercoagulable state   [] Anemic   Gastrointestinal:  [] Blood in stool   [] Vomiting blood  [x] Gastroesophageal reflux/heartburn   [] Abdominal pain Genitourinary:  [] Chronic kidney disease   [] Difficult urination  [] Frequent urination  [] Burning with urination   [] Hematuria Skin:  [] Rashes   [] Ulcers   [] Wounds Psychological:  [] History of anxiety   []  History of major depression.  Physical Examination  BP 124/71 (BP Location: Left Arm)   Pulse 67   Resp 16   Wt 209 lb (94.8 kg)   BMI 29.99 kg/m  Gen:  WD/WN, NAD Head: [place]/AT, No temporalis wasting. Ear/Nose/Throat: Hearing grossly intact, nares w/o erythema or drainage Eyes: Conjunctiva clear. Sclera non-icteric Neck: Supple.  Trachea midline Pulmonary:  Good air movement, no use of accessory muscles.  Cardiac: RRR, no JVD Vascular:  Vessel Right Left  Radial Palpable Palpable                          PT Not Palpable 1+ Palpable  DP 1+ Palpable 1+ Palpable   Gastrointestinal: soft, non-tender/non-distended. No guarding/reflex.  Musculoskeletal: M/S 5/5 throughout.  No deformity or atrophy. No significant LE edema. Neurologic: Sensation grossly intact in extremities.  Symmetrical.  Speech is fluent.  Psychiatric: Judgment intact, Mood & affect appropriate for pt's clinical situation. Dermatologic: No rashes or ulcers noted.  No cellulitis or open wounds.      Labs No results found for this or any previous visit (from the past 2160 hour(s)).  Radiology No results found.  Assessment/Plan  Atherosclerosis of native arteries of  extremity with rest pain [facility]) The patient has had progression of his symptoms and now has symptoms concerning for ischemic rest pain and has had worsening disabling claudication symptoms of the right leg.  He has tried conservative measures for over a year now but the symptoms have progressed and become more severe.  He is now ready to proceed with angiography and possible revascularization of the right lower extremity.  We discussed that some lesions are most appropriately treated with surgical therapy but if it is appropriate, we will try  to have concomitant revascularization at the time of angiogram.  I discussed the risks and benefits of the procedure.  The patient voices understanding and is agreeable to proceed.  Essential hypertension blood pressure control important in reducing the progression of atherosclerotic disease. On appropriate oral medications.    Diabetes mellitus type 2 with complications (HCC) blood glucose control important in reducing the progression of atherosclerotic disease. Also, involved in wound healing. On appropriate medications.    Festus Barren, MD  02/27/2023 4:22 PM    This note was created with Dragon medical transcription system.  Any errors from dictation are purely unintentional

## 2023-02-27 NOTE — Assessment & Plan Note (Signed)
blood glucose control important in reducing the progression of atherosclerotic disease. Also, involved in wound healing. On appropriate medications.

## 2023-02-27 NOTE — Assessment & Plan Note (Signed)
The patient has had progression of his symptoms and now has symptoms concerning for ischemic rest pain and has had worsening disabling claudication symptoms of the right leg.  He has tried conservative measures for over a year now but the symptoms have progressed and become more severe.  He is now ready to proceed with angiography and possible revascularization of the right lower extremity.  We discussed that some lesions are most appropriately treated with surgical therapy but if it is appropriate, we will try to have concomitant revascularization at the time of angiogram.  I discussed the risks and benefits of the procedure.  The patient voices understanding and is agreeable to proceed.

## 2023-02-27 NOTE — Progress Notes (Signed)
MRN : [id number]  Phillip Mendez is a 72 y.o. (July 29, 1950) male who presents with chief complaint of  Chief Complaint  Patient presents with   Follow-up    Ultrasound follow up  .  History of Present Illness: Patient returns today in follow up of his PAD. His right leg claudication has gotten significantly worse he is now having pain to wake him at night from his right foot.  He can walk only about 30 yards before having to stop and he has become disabled from leg pain.  He is also now having more numbness and tingling.  He tried to go to a football game last weekend and could not walk with his friends to his seats and was extremely debilitated.  Noninvasive studies today demonstrate roughly stable ABIs in the 0.6 range on the right and 0.8 range on the left, but his digit pressures and waveforms remain markedly reduced on the right.  Current Outpatient Medications  Medication Sig Dispense Refill   acetaminophen (TYLENOL) 325 MG tablet Take 1-2 tablets (325-650 mg total) by mouth every 6 (six) hours as needed for mild pain (pain score 1-3 or temp > 100.5).     albuterol (VENTOLIN HFA) 108 (90 Base) MCG/ACT inhaler Inhale 2 puffs into the lungs every 6 (six) hours as needed for wheezing or shortness of breath. 8 g 2   amLODipine (NORVASC) 5 MG tablet Take 5 mg by mouth daily.     aspirin EC 81 MG tablet Take 81 mg by mouth daily. Swallow whole.     calcium carbonate (TUMS - DOSED IN MG ELEMENTAL CALCIUM) 500 MG chewable tablet Chew 2 tablets by mouth daily as needed for indigestion or heartburn.     INCRUSE ELLIPTA 62.5 MCG/ACT AEPB Inhale 1 puff into the lungs daily.     losartan-hydrochlorothiazide (HYZAAR) 100-12.5 MG tablet Take 1 tablet by mouth daily.     rosuvastatin (CRESTOR) 20 MG tablet Take 20 mg by mouth daily.     predniSONE (DELTASONE) 50 MG tablet Take 1 tablet (50 mg total) by mouth daily with breakfast. (Patient not taking: Reported on 11/29/2021) 4 tablet 0   sildenafil  (REVATIO) 20 MG tablet Take 20 mg by mouth daily as needed for erectile dysfunction. (Patient not taking: Reported on 11/29/2021)  2   terbinafine (LAMISIL) 250 MG tablet Take 500 mg by mouth daily.  (Patient not taking: Reported on 11/29/2021)     No current facility-administered medications for this visit.    Past Medical History:  Diagnosis Date   Arthritis    GERD (gastroesophageal reflux disease)     Past Surgical History:  Procedure Laterality Date   FRACTURE SURGERY Left    wrist   HERNIA REPAIR Bilateral    groin   MUSCLE REPAIR Right    arm and leg   TOTAL HIP ARTHROPLASTY Right 09/04/2017   Procedure: TOTAL HIP ARTHROPLASTY ANTERIOR APPROACH;  Surgeon: Kennedy Bucker, MD;  Location: [facility] ORS;  Service: Orthopedics;  Laterality: Right;     Social History   Tobacco Use   Smoking status: Every Day    Current packs/day: 1.00    Average packs/day: 1 pack/day for 41.0 years (41.0 ttl pk-yrs)    Types: Cigarettes   Smokeless tobacco: Never  Vaping Use   Vaping status: Never Used  Substance Use Topics   Alcohol use: Yes    Alcohol/week: 18.0 standard drinks of alcohol    Types: 18 Cans of beer per week  Drug use: Never       Family History  Problem Relation Age of Onset   Cancer Brother   No bleeding or clotting disorders No aneurysms  No Known Allergies   REVIEW OF SYSTEMS (Negative unless checked)  Constitutional: [] Weight loss  [] Fever  [] Chills Cardiac: [] Chest pain   [] Chest pressure   [] Palpitations   [] Shortness of breath when laying flat   [] Shortness of breath at rest   [] Shortness of breath with exertion. Vascular:  [x] Pain in legs with walking   [] Pain in legs at rest   [] Pain in legs when laying flat   [x] Claudication   [] Pain in feet when walking  [x] Pain in feet at rest  [] Pain in feet when laying flat   [] History of DVT   [] Phlebitis   [] Swelling in legs   [] Varicose veins   [] Non-healing ulcers Pulmonary:   [] Uses home oxygen   [] Productive  cough   [] Hemoptysis   [] Wheeze  [] COPD   [] Asthma Neurologic:  [] Dizziness  [] Blackouts   [] Seizures   [] History of stroke   [] History of TIA  [] Aphasia   [] Temporary blindness   [] Dysphagia   [] Weakness or numbness in arms   [] Weakness or numbness in legs Musculoskeletal:  [x] Arthritis   [] Joint swelling   [] Joint pain   [] Low back pain Hematologic:  [] Easy bruising  [] Easy bleeding   [] Hypercoagulable state   [] Anemic   Gastrointestinal:  [] Blood in stool   [] Vomiting blood  [x] Gastroesophageal reflux/heartburn   [] Abdominal pain Genitourinary:  [] Chronic kidney disease   [] Difficult urination  [] Frequent urination  [] Burning with urination   [] Hematuria Skin:  [] Rashes   [] Ulcers   [] Wounds Psychological:  [] History of anxiety   []  History of major depression.  Physical Examination  BP 124/71 (BP Location: Left Arm)   Pulse 67   Resp 16   Wt 209 lb (94.8 kg)   BMI 29.99 kg/m  Gen:  WD/WN, NAD Head: [place]/AT, No temporalis wasting. Ear/Nose/Throat: Hearing grossly intact, nares w/o erythema or drainage Eyes: Conjunctiva clear. Sclera non-icteric Neck: Supple.  Trachea midline Pulmonary:  Good air movement, no use of accessory muscles.  Cardiac: RRR, no JVD Vascular:  Vessel Right Left  Radial Palpable Palpable                          PT Not Palpable 1+ Palpable  DP 1+ Palpable 1+ Palpable   Gastrointestinal: soft, non-tender/non-distended. No guarding/reflex.  Musculoskeletal: M/S 5/5 throughout.  No deformity or atrophy. No significant LE edema. Neurologic: Sensation grossly intact in extremities.  Symmetrical.  Speech is fluent.  Psychiatric: Judgment intact, Mood & affect appropriate for pt's clinical situation. Dermatologic: No rashes or ulcers noted.  No cellulitis or open wounds.      Labs No results found for this or any previous visit (from the past 2160 hour(s)).  Radiology No results found.  Assessment/Plan  Atherosclerosis of native arteries of  extremity with rest pain [facility]) The patient has had progression of his symptoms and now has symptoms concerning for ischemic rest pain and has had worsening disabling claudication symptoms of the right leg.  He has tried conservative measures for over a year now but the symptoms have progressed and become more severe.  He is now ready to proceed with angiography and possible revascularization of the right lower extremity.  We discussed that some lesions are most appropriately treated with surgical therapy but if it is appropriate, we will try  to have concomitant revascularization at the time of angiogram.  I discussed the risks and benefits of the procedure.  The patient voices understanding and is agreeable to proceed.  Essential hypertension blood pressure control important in reducing the progression of atherosclerotic disease. On appropriate oral medications.    Diabetes mellitus type 2 with complications (HCC) blood glucose control important in reducing the progression of atherosclerotic disease. Also, involved in wound healing. On appropriate medications.    Festus Barren, MD  02/27/2023 4:22 PM    This note was created with Dragon medical transcription system.  Any errors from dictation are purely unintentional

## 2023-02-27 NOTE — Assessment & Plan Note (Signed)
blood pressure control important in reducing the progression of atherosclerotic disease. On appropriate oral medications.  

## 2023-03-01 LAB — VAS US ABI WITH/WO TBI
Left ABI: 0.79
Right ABI: 0.62

## 2023-03-12 ENCOUNTER — Telehealth (INDEPENDENT_AMBULATORY_CARE_PROVIDER_SITE_OTHER): Payer: Self-pay

## 2023-03-12 NOTE — Telephone Encounter (Signed)
Spoke with the patient and he is scheduled with Dr. Wyn Quaker on 03/15/23 with a 8:00 am arrival time to the [facility] for a right leg angio at the [facility]. Pre-procedure instructions were discussed and patient stated he wrote them down. Patient is not active in Mychart since 2021. Pre-procedure instructions will be mailed.

## 2023-03-15 ENCOUNTER — Encounter
Admission: RE | Disposition: A | Discharge status: Home / Self Care | Payer: Self-pay | Source: Home / Self Care | Attending: Vascular Surgery

## 2023-03-15 ENCOUNTER — Encounter: Payer: Self-pay | Admitting: Vascular Surgery

## 2023-03-15 ENCOUNTER — Ambulatory Visit
Admission: RE | Admit: 2023-03-15 | Discharge: 2023-03-15 | Disposition: A | Discharge status: Home / Self Care | Payer: Medicare HMO | Attending: Vascular Surgery | Admitting: Vascular Surgery

## 2023-03-15 ENCOUNTER — Other Ambulatory Visit: Payer: Self-pay

## 2023-03-15 DIAGNOSIS — I70221 Atherosclerosis of native arteries of extremities with rest pain, right leg: Secondary | ICD-10-CM | POA: Diagnosis not present

## 2023-03-15 DIAGNOSIS — E1151 Type 2 diabetes mellitus with diabetic peripheral angiopathy without gangrene: Secondary | ICD-10-CM | POA: Diagnosis present

## 2023-03-15 DIAGNOSIS — F17210 Nicotine dependence, cigarettes, uncomplicated: Secondary | ICD-10-CM | POA: Diagnosis not present

## 2023-03-15 DIAGNOSIS — I10 Essential (primary) hypertension: Secondary | ICD-10-CM | POA: Insufficient documentation

## 2023-03-15 DIAGNOSIS — I70212 Atherosclerosis of native arteries of extremities with intermittent claudication, left leg: Secondary | ICD-10-CM

## 2023-03-15 DIAGNOSIS — I70229 Atherosclerosis of native arteries of extremities with rest pain, unspecified extremity: Secondary | ICD-10-CM

## 2023-03-15 HISTORY — DX: Essential (primary) hypertension: I10

## 2023-03-15 HISTORY — PX: LOWER EXTREMITY ANGIOGRAPHY: CATH118251

## 2023-03-15 LAB — GLUCOSE, CAPILLARY: Glucose-Capillary: 131 mg/dL — ABNORMAL HIGH (ref 70–99)

## 2023-03-15 LAB — CREATININE, SERUM
Creatinine, Ser: 1.22 mg/dL (ref 0.61–1.24)
GFR, Estimated: >60 mL/min (ref >60)

## 2023-03-15 LAB — BUN: BUN: 21 mg/dL (ref 8–23)

## 2023-03-15 SURGERY — LOWER EXTREMITY ANGIOGRAPHY
Anesthesia: Moderate Sedation | Site: Leg Lower | Laterality: Right

## 2023-03-15 MED ORDER — FAMOTIDINE 20 MG PO TABS: 40.0000 mg | ORAL_TABLET | Freq: Once | ORAL | Status: DC | PRN

## 2023-03-15 MED ORDER — HEPARIN (PORCINE) IN NACL 1000-0.9 UT/500ML-% IV SOLN
INTRAVENOUS | Status: DC | PRN
  Administered 2023-03-15: 1000 mL

## 2023-03-15 MED ORDER — ONDANSETRON HCL 4 MG/2ML IJ SOLN: 4.0000 mg | Freq: Four times a day (QID) | INTRAMUSCULAR | Status: DC | PRN

## 2023-03-15 MED ORDER — DIPHENHYDRAMINE HCL 50 MG/ML IJ SOLN: 50.0000 mg | Freq: Once | INTRAMUSCULAR | Status: DC | PRN

## 2023-03-15 MED ORDER — LIDOCAINE-EPINEPHRINE (PF) 1 %-1:200000 IJ SOLN
INTRAMUSCULAR | Status: DC | PRN
  Administered 2023-03-15: 10 mL

## 2023-03-15 MED ORDER — FENTANYL CITRATE (PF) 100 MCG/2ML IJ SOLN
INTRAMUSCULAR | Status: DC | PRN
  Administered 2023-03-15: 25 ug via INTRAVENOUS
  Administered 2023-03-15: 50 ug via INTRAVENOUS

## 2023-03-15 MED ORDER — HYDROMORPHONE HCL 1 MG/ML IJ SOLN: 1.0000 mg | Freq: Once | INTRAMUSCULAR | Status: DC | PRN

## 2023-03-15 MED ORDER — CEFAZOLIN SODIUM-DEXTROSE 2-4 GM/100ML-% IV SOLN
2.0000 g | INTRAVENOUS | Status: AC
  Administered 2023-03-15: 2 g via INTRAVENOUS

## 2023-03-15 MED ORDER — MIDAZOLAM HCL 2 MG/2ML IJ SOLN
INTRAMUSCULAR | Status: DC | PRN
  Administered 2023-03-15: 2 mg via INTRAVENOUS
  Administered 2023-03-15: 1 mg via INTRAVENOUS

## 2023-03-15 MED ORDER — IODIXANOL 320 MG/ML IV SOLN
INTRAVENOUS | Status: DC | PRN
  Administered 2023-03-15: 35 mL

## 2023-03-15 MED ORDER — METHYLPREDNISOLONE SODIUM SUCC 125 MG IJ SOLR: 125.0000 mg | Freq: Once | INTRAMUSCULAR | Status: DC | PRN

## 2023-03-15 MED ORDER — MIDAZOLAM HCL 2 MG/ML PO SYRP: 8.0000 mg | ORAL_SOLUTION | Freq: Once | ORAL | Status: DC | PRN

## 2023-03-15 MED ORDER — HEPARIN SODIUM (PORCINE) 1000 UNIT/ML IJ SOLN
INTRAMUSCULAR | Status: AC
  Filled 2023-03-15: qty 10

## 2023-03-15 MED ORDER — CEFAZOLIN SODIUM-DEXTROSE 2-4 GM/100ML-% IV SOLN
INTRAVENOUS | Status: AC
  Filled 2023-03-15: qty 100

## 2023-03-15 MED ORDER — FENTANYL CITRATE (PF) 100 MCG/2ML IJ SOLN
INTRAMUSCULAR | Status: AC
  Filled 2023-03-15: qty 2

## 2023-03-15 MED ORDER — SODIUM CHLORIDE 0.9 % IV SOLN: INTRAVENOUS | Status: DC

## 2023-03-15 MED ORDER — MIDAZOLAM HCL 5 MG/5ML IJ SOLN
INTRAMUSCULAR | Status: AC
  Filled 2023-03-15: qty 5

## 2023-03-15 SURGICAL SUPPLY — 9 items
CANNULA 5F STIFF (CANNULA) IMPLANT
CATH ANGIO 5F PIGTAIL 65CM (CATHETERS) IMPLANT
COVER PROBE ULTRASOUND 5X96 (MISCELLANEOUS) IMPLANT
DEVICE STARCLOSE SE CLOSURE (Vascular Products) IMPLANT
PACK ANGIOGRAPHY (CUSTOM PROCEDURE TRAY) ×1 IMPLANT
SHEATH BRITE TIP 5FRX11 (SHEATH) IMPLANT
SYR MEDRAD MARK 7 150ML (SYRINGE) IMPLANT
TUBING CONTRAST HIGH PRESS 72 (TUBING) IMPLANT
WIRE GUIDERIGHT .035X150 (WIRE) IMPLANT

## 2023-03-15 NOTE — Interval H&P Note (Signed)
History and Physical Interval Note:  03/15/2023 7:32 AM  Phillip Mendez  has presented today for surgery, with the diagnosis of RLE Angio   ASO w rest pain.  The various methods of treatment have been discussed with the patient and family. After consideration of risks, benefits and other options for treatment, the patient has consented to  Procedure(s): Lower Extremity Angiography (Right) as a surgical intervention.  The patient's history has been reviewed, patient examined, no change in status, stable for surgery.  I have reviewed the patient's chart and labs.  Questions were answered to the patient's satisfaction.     Festus Barren

## 2023-03-15 NOTE — Op Note (Signed)
[facility]  Percutaneous Study/Intervention Procedural Note   Date of Surgery: 03/15/2023  Surgeon(s):Kaymon Denomme    Assistants:none  Pre-operative Diagnosis: PAD with disabling claudication symptoms bilaterally and early rest pain of the right lower extremity  Post-operative diagnosis:  Same  Procedure(s) Performed:             1.  Ultrasound guidance for vascular access left femoral artery             2.  Catheter placement into right common femoral artery from left femoral approach             3.  Aortogram and selective right lower extremity angiogram             4.  StarClose closure device left femoral artery  EBL: 3 cc  Contrast: 35 cc  Fluoro Time: 1.4 minutes  Moderate Conscious Sedation Time: approximately 20 minutes using 3 mg of Versed and 75 mcg of Fentanyl              Indications:  Patient is a 72 y.o.male with disabling claudication symptoms bilaterally and now symptoms concerning for rest pain on the right. The patient has noninvasive study showing significant reduction in the ABIs bilaterally. The patient is brought in for angiography for further evaluation and potential treatment.  Due to the limb threatening nature of the situation, angiogram was performed for attempted limb salvage. The patient is aware that if the procedure fails, amputation would be expected.  The patient also understands that even with successful revascularization, amputation may still be required due to the severity of the situation.  Risks and benefits are discussed and informed consent is obtained.   Procedure:  The patient was identified and appropriate procedural time out was performed.  The patient was then placed supine on the table and prepped and draped in the usual sterile fashion. Moderate conscious sedation was administered during a face to face encounter with the patient throughout the procedure with my supervision of the RN administering medicines and  monitoring the patient's vital signs, pulse oximetry, telemetry and mental status throughout from the start of the procedure until the patient was taken to the recovery room. Ultrasound was used to evaluate the left common femoral artery.  It was patent in the proximal and mid segment but heavily diseased on ultrasound particularly in the distal segment.  A digital ultrasound image was acquired.  A Seldinger needle was used to access the mid left common femoral artery under direct ultrasound guidance and a permanent image was performed.  A 0.035 J wire was advanced without resistance and a 5Fr sheath was placed.  Pigtail catheter was placed into the aorta and an AP aortogram was performed. This demonstrated normal renal arteries and normal aorta and iliac segments without significant stenosis. I then crossed the aortic bifurcation and advanced to the right femoral head. Selective right lower extremity angiogram was then performed. This demonstrated complete occlusion of the right common femoral artery as well as the origins of the SFA and profunda femoris arteries.  The SFA was heavily diseased in the proximal and mid segment but the distal SFA reconstituted and was fairly normal from the distal SFA down to the popliteal artery.  There is then three-vessel runoff distally without focal stenosis. It was clear that this would have to be addressed with a right femoral endarterectomy with concomitant SFA stents versus a femoral to popliteal bypass.  No endovascular therapy without correction of the femoral artery  disease surgically was appropriate at this time.  I elected to terminate the procedure.  Imaging was performed of the left femoral artery.  The area access to the mid common femoral artery was calcified but not stenotic.  The distal common femoral artery and proximal SFA had near occlusive stenosis with heavy calcification and moderate stenosis tracking down into the profunda femoris artery on the left as well.   The sheath was removed and StarClose closure device was deployed in the left femoral artery with excellent hemostatic result. The patient was taken to the recovery room in stable condition having tolerated the procedure well.  Findings:               Aortogram:  This demonstrated normal renal arteries and normal aorta and iliac segments without significant stenosis.             Right Lower Extremity:  This demonstrated normal renal arteries and normal aorta and iliac segments without significant stenosis. I then crossed the aortic bifurcation and advanced to the right femoral head. Selective right lower extremity angiogram was then performed. This demonstrated complete occlusion of the right common femoral artery as well as the origins of the SFA and profunda femoris arteries.  The SFA was heavily diseased in the proximal and mid segment but the distal SFA reconstituted and was fairly normal from the distal SFA down to the popliteal artery.  There is then three-vessel runoff distally without focal stenosis.   Disposition: Patient was taken to the recovery room in stable condition having tolerated the procedure well.  Complications: None  Festus Barren 03/15/2023 9:19 AM   This note was created with Dragon Medical transcription system. Any errors in dictation are purely unintentional.

## 2023-04-06 ENCOUNTER — Ambulatory Visit (INDEPENDENT_AMBULATORY_CARE_PROVIDER_SITE_OTHER): Payer: Medicare HMO | Admitting: Vascular Surgery

## 2023-04-11 ENCOUNTER — Telehealth (INDEPENDENT_AMBULATORY_CARE_PROVIDER_SITE_OTHER): Payer: Self-pay

## 2023-04-11 NOTE — Telephone Encounter (Signed)
Spoke with the patient and he is scheduled with Dr. Wyn Quaker on 04/18/23 for a bilateral femoral endarterectomy with right SFA stent placement and poss/ fem-pop bypass at the [facility]. Pre-op is scheduled on 04/16/23 at 11:00 am at the [facility]. Pre-surgical instructions were discussed and will be sent to Mychart and mailed.

## 2023-04-13 ENCOUNTER — Encounter (INDEPENDENT_AMBULATORY_CARE_PROVIDER_SITE_OTHER): Payer: Self-pay | Admitting: Vascular Surgery

## 2023-04-13 ENCOUNTER — Ambulatory Visit (INDEPENDENT_AMBULATORY_CARE_PROVIDER_SITE_OTHER): Payer: Medicare HMO | Admitting: Vascular Surgery

## 2023-04-13 VITALS — BP 133/74 | HR 69 | Resp 16 | Wt 209.6 lb

## 2023-04-13 DIAGNOSIS — I10 Essential (primary) hypertension: Secondary | ICD-10-CM | POA: Diagnosis not present

## 2023-04-13 DIAGNOSIS — I70221 Atherosclerosis of native arteries of extremities with rest pain, right leg: Secondary | ICD-10-CM

## 2023-04-13 DIAGNOSIS — E118 Type 2 diabetes mellitus with unspecified complications: Secondary | ICD-10-CM | POA: Diagnosis not present

## 2023-04-13 NOTE — H&P (View-Only) (Signed)
MRN : [id number]  Phillip Mendez is a 72 y.o. (Nov 14, 1950) male who presents with chief complaint of  Chief Complaint  Patient presents with   Follow-up    [facility] 3 week follow up  .  History of Present Illness: Patient returns today in follow up of his peripheral arterial disease.  He is still having rest pain symptoms of the right lower extremity with dangling of his feet off the bed at night and waking him from sleep.  His walking distances are incredibly impaired at this point.  On the left leg, he has pain with activity but not at rest.  He had an angiogram a few weeks ago demonstrating dense calcific moderate stenosis of the left common femoral artery and right common femoral artery with complete occlusion caring down into the origins of the profundus and superficial femoral arteries.  The superficial femoral artery was fairly heavily diseased and had an occlusion down to near Hunter's canal where he reconstituted and was fairly normal with normal tibial runoff distally.  Current Outpatient Medications  Medication Sig Dispense Refill   acetaminophen (TYLENOL) 325 MG tablet Take 1-2 tablets (325-650 mg total) by mouth every 6 (six) hours as needed for mild pain (pain score 1-3 or temp > 100.5).     albuterol (VENTOLIN HFA) 108 (90 Base) MCG/ACT inhaler Inhale 2 puffs into the lungs every 6 (six) hours as needed for wheezing or shortness of breath. 8 g 2   amLODipine (NORVASC) 5 MG tablet Take 5 mg by mouth daily.     aspirin EC 81 MG tablet Take 81 mg by mouth daily. Swallow whole.     calcium carbonate (TUMS - DOSED IN MG ELEMENTAL CALCIUM) 500 MG chewable tablet Chew 2 tablets by mouth daily as needed for indigestion or heartburn.     famotidine (PEPCID) 20 MG tablet Take 20 mg by mouth daily as needed for heartburn or indigestion.     INCRUSE ELLIPTA 62.5 MCG/ACT AEPB Inhale 1 puff into the lungs daily.     losartan-hydrochlorothiazide (HYZAAR) 100-12.5 MG tablet Take 1 tablet by mouth  daily.     rosuvastatin (CRESTOR) 20 MG tablet Take 20 mg by mouth daily.     predniSONE (DELTASONE) 50 MG tablet Take 1 tablet (50 mg total) by mouth daily with breakfast. (Patient not taking: Reported on 11/29/2021) 4 tablet 0   sildenafil (REVATIO) 20 MG tablet Take 20 mg by mouth daily as needed for erectile dysfunction. (Patient not taking: Reported on 11/29/2021)  2   No current facility-administered medications for this visit.    Past Medical History:  Diagnosis Date   Arthritis    GERD (gastroesophageal reflux disease)    Hypertension     Past Surgical History:  Procedure Laterality Date   FRACTURE SURGERY Left    wrist   HERNIA REPAIR Bilateral    groin   LOWER EXTREMITY ANGIOGRAPHY Right 03/15/2023   Procedure: Lower Extremity Angiography;  Surgeon: Annice Needy, MD;  Location: [facility];  Service: Cardiovascular;  Laterality: Right;   MUSCLE REPAIR Right    arm and leg   TOTAL HIP ARTHROPLASTY Right 09/04/2017   Procedure: TOTAL HIP ARTHROPLASTY ANTERIOR APPROACH;  Surgeon: Kennedy Bucker, MD;  Location: [facility] ORS;  Service: Orthopedics;  Laterality: Right;     Social History   Tobacco Use   Smoking status: Every Day    Current packs/day: 1.00    Average packs/day: 1 pack/day for 41.0 years (41.0 ttl  pk-yrs)    Types: Cigarettes   Smokeless tobacco: Never  Vaping Use   Vaping status: Never Used  Substance Use Topics   Alcohol use: Yes    Alcohol/week: 18.0 standard drinks of alcohol    Types: 18 Cans of beer per week   Drug use: Never      Family History  Problem Relation Age of Onset   Cancer Brother   No bleeding or clotting disorders No aneurysms  No Known Allergies  REVIEW OF SYSTEMS (Negative unless checked)   Constitutional: [] Weight loss  [] Fever  [] Chills Cardiac: [] Chest pain   [] Chest pressure   [] Palpitations   [] Shortness of breath when laying flat   [] Shortness of breath at rest   [] Shortness of breath with exertion. Vascular:   [x] Pain in legs with walking   [] Pain in legs at rest   [] Pain in legs when laying flat   [x] Claudication   [] Pain in feet when walking  [x] Pain in feet at rest  [] Pain in feet when laying flat   [] History of DVT   [] Phlebitis   [] Swelling in legs   [] Varicose veins   [] Non-healing ulcers Pulmonary:   [] Uses home oxygen   [] Productive cough   [] Hemoptysis   [] Wheeze  [] COPD   [] Asthma Neurologic:  [] Dizziness  [] Blackouts   [] Seizures   [] History of stroke   [] History of TIA  [] Aphasia   [] Temporary blindness   [] Dysphagia   [] Weakness or numbness in arms   [] Weakness or numbness in legs Musculoskeletal:  [x] Arthritis   [] Joint swelling   [] Joint pain   [] Low back pain Hematologic:  [] Easy bruising  [] Easy bleeding   [] Hypercoagulable state   [] Anemic   Gastrointestinal:  [] Blood in stool   [] Vomiting blood  [x] Gastroesophageal reflux/heartburn   [] Abdominal pain Genitourinary:  [] Chronic kidney disease   [] Difficult urination  [] Frequent urination  [] Burning with urination   [] Hematuria Skin:  [] Rashes   [] Ulcers   [] Wounds Psychological:  [] History of anxiety   []  History of major depression.  Physical Examination  BP 133/74 (BP Location: Left Arm)   Pulse 69   Resp 16   Wt 209 lb 9.6 oz (95.1 kg)   BMI 30.07 kg/m  Gen:  WD/WN, NAD Head: [place]/AT, No temporalis wasting. Ear/Nose/Throat: Hearing grossly intact, nares w/o erythema or drainage Eyes: Conjunctiva clear. Sclera non-icteric Neck: Supple.  Trachea midline Pulmonary:  Good air movement, no use of accessory muscles.  Cardiac: RRR, no JVD Vascular:  Vessel Right Left  Radial Palpable Palpable                          DP 1+ Palpable 1+ Palpable  PT Not Palpable 1+ Palpable   Gastrointestinal: soft, non-tender/non-distended. No guarding/reflex.  Musculoskeletal: M/S 5/5 throughout.  No deformity or atrophy.  No edema. Neurologic: Sensation grossly intact in extremities.  Symmetrical.  Speech is fluent.  Psychiatric:  Judgment intact, Mood & affect appropriate for pt's clinical situation. Dermatologic: No rashes or ulcers noted.  No cellulitis or open wounds.      Labs Recent Results (from the past 2160 hour(s))  VAS [place] ABI WITH/WO TBI     Status: None   Collection Time: 02/27/23  9:41 AM  Result Value Ref Range   Right ABI .62    Left ABI .79   Glucose, capillary     Status: Abnormal   Collection Time: 03/15/23  7:44 AM  Result Value Ref Range  Glucose-Capillary 131 (H) 70 - 99 mg/dL    Comment: Glucose reference range applies only to samples taken after fasting for at least 8 hours.  BUN     Status: None   Collection Time: 03/15/23  7:46 AM  Result Value Ref Range   BUN 21 8 - 23 mg/dL    Comment: Performed at [facility], [address] [place], [place] [address]  Creatinine, serum     Status: None   Collection Time: 03/15/23  7:46 AM  Result Value Ref Range   Creatinine, Ser 1.22 0.61 - 1.24 mg/dL   GFR, Estimated >60 >45 mL/min    Comment: (NOTE) Calculated using the CKD-EPI Creatinine Equation (2021) Performed at [facility], [address] [place], [place] [address]     Radiology PERIPHERAL VASCULAR CATHETERIZATION  Result Date: 03/15/2023 See surgical note for result.   Assessment/Plan  Atherosclerosis of native arteries of extremity with rest pain [facility]) He had an angiogram a few weeks ago demonstrating dense calcific moderate stenosis of the left common femoral artery and right common femoral artery with complete occlusion caring down into the origins of the profundus and superficial femoral arteries.  The superficial femoral artery was fairly heavily diseased and had an occlusion down to near Hunter's canal where he reconstituted and was fairly normal with normal tibial runoff distally. He is scheduled for right common femoral endarterectomy and right SFA stent placement versus right femoral to popliteal bypass.  He also has significant left common  femoral disease with symptoms and desires to have this treated concomitantly with left common femoral endarterectomy which I think is very reasonable.  I have discussed the risks and benefits of the procedure with the patient today.  I have discussed the details and the performance of the procedure.  Patient voices his understanding and is agreeable to proceed with bilateral femoral endarterectomies, and right SFA stent versus right femoral to popliteal bypass.  Essential hypertension blood pressure control important in reducing the progression of atherosclerotic disease. On appropriate oral medications.       Diabetes mellitus type 2 with complications (HCC) blood glucose control important in reducing the progression of atherosclerotic disease. Also, involved in wound healing. On appropriate medications.     Festus Barren, MD  04/13/2023 11:59 AM    This note was created with Dragon medical transcription system.  Any errors from dictation are purely unintentional

## 2023-04-13 NOTE — Progress Notes (Signed)
MRN : [id number]  Rylo Ficker is a 72 y.o. (Nov 14, 1950) male who presents with chief complaint of  Chief Complaint  Patient presents with   Follow-up    [facility] 3 week follow up  .  History of Present Illness: Patient returns today in follow up of his peripheral arterial disease.  He is still having rest pain symptoms of the right lower extremity with dangling of his feet off the bed at night and waking him from sleep.  His walking distances are incredibly impaired at this point.  On the left leg, he has pain with activity but not at rest.  He had an angiogram a few weeks ago demonstrating dense calcific moderate stenosis of the left common femoral artery and right common femoral artery with complete occlusion caring down into the origins of the profundus and superficial femoral arteries.  The superficial femoral artery was fairly heavily diseased and had an occlusion down to near Hunter's canal where he reconstituted and was fairly normal with normal tibial runoff distally.  Current Outpatient Medications  Medication Sig Dispense Refill   acetaminophen (TYLENOL) 325 MG tablet Take 1-2 tablets (325-650 mg total) by mouth every 6 (six) hours as needed for mild pain (pain score 1-3 or temp > 100.5).     albuterol (VENTOLIN HFA) 108 (90 Base) MCG/ACT inhaler Inhale 2 puffs into the lungs every 6 (six) hours as needed for wheezing or shortness of breath. 8 g 2   amLODipine (NORVASC) 5 MG tablet Take 5 mg by mouth daily.     aspirin EC 81 MG tablet Take 81 mg by mouth daily. Swallow whole.     calcium carbonate (TUMS - DOSED IN MG ELEMENTAL CALCIUM) 500 MG chewable tablet Chew 2 tablets by mouth daily as needed for indigestion or heartburn.     famotidine (PEPCID) 20 MG tablet Take 20 mg by mouth daily as needed for heartburn or indigestion.     INCRUSE ELLIPTA 62.5 MCG/ACT AEPB Inhale 1 puff into the lungs daily.     losartan-hydrochlorothiazide (HYZAAR) 100-12.5 MG tablet Take 1 tablet by mouth  daily.     rosuvastatin (CRESTOR) 20 MG tablet Take 20 mg by mouth daily.     predniSONE (DELTASONE) 50 MG tablet Take 1 tablet (50 mg total) by mouth daily with breakfast. (Patient not taking: Reported on 11/29/2021) 4 tablet 0   sildenafil (REVATIO) 20 MG tablet Take 20 mg by mouth daily as needed for erectile dysfunction. (Patient not taking: Reported on 11/29/2021)  2   No current facility-administered medications for this visit.    Past Medical History:  Diagnosis Date   Arthritis    GERD (gastroesophageal reflux disease)    Hypertension     Past Surgical History:  Procedure Laterality Date   FRACTURE SURGERY Left    wrist   HERNIA REPAIR Bilateral    groin   LOWER EXTREMITY ANGIOGRAPHY Right 03/15/2023   Procedure: Lower Extremity Angiography;  Surgeon: Annice Needy, MD;  Location: [facility] INVASIVE CV LAB;  Service: Cardiovascular;  Laterality: Right;   MUSCLE REPAIR Right    arm and leg   TOTAL HIP ARTHROPLASTY Right 09/04/2017   Procedure: TOTAL HIP ARTHROPLASTY ANTERIOR APPROACH;  Surgeon: Kennedy Bucker, MD;  Location: [facility] ORS;  Service: Orthopedics;  Laterality: Right;     Social History   Tobacco Use   Smoking status: Every Day    Current packs/day: 1.00    Average packs/day: 1 pack/day for 41.0 years (41.0 ttl  pk-yrs)    Types: Cigarettes   Smokeless tobacco: Never  Vaping Use   Vaping status: Never Used  Substance Use Topics   Alcohol use: Yes    Alcohol/week: 18.0 standard drinks of alcohol    Types: 18 Cans of beer per week   Drug use: Never      Family History  Problem Relation Age of Onset   Cancer Brother   No bleeding or clotting disorders No aneurysms  No Known Allergies  REVIEW OF SYSTEMS (Negative unless checked)   Constitutional: [] Weight loss  [] Fever  [] Chills Cardiac: [] Chest pain   [] Chest pressure   [] Palpitations   [] Shortness of breath when laying flat   [] Shortness of breath at rest   [] Shortness of breath with exertion. Vascular:   [x] Pain in legs with walking   [] Pain in legs at rest   [] Pain in legs when laying flat   [x] Claudication   [] Pain in feet when walking  [x] Pain in feet at rest  [] Pain in feet when laying flat   [] History of DVT   [] Phlebitis   [] Swelling in legs   [] Varicose veins   [] Non-healing ulcers Pulmonary:   [] Uses home oxygen   [] Productive cough   [] Hemoptysis   [] Wheeze  [] COPD   [] Asthma Neurologic:  [] Dizziness  [] Blackouts   [] Seizures   [] History of stroke   [] History of TIA  [] Aphasia   [] Temporary blindness   [] Dysphagia   [] Weakness or numbness in arms   [] Weakness or numbness in legs Musculoskeletal:  [x] Arthritis   [] Joint swelling   [] Joint pain   [] Low back pain Hematologic:  [] Easy bruising  [] Easy bleeding   [] Hypercoagulable state   [] Anemic   Gastrointestinal:  [] Blood in stool   [] Vomiting blood  [x] Gastroesophageal reflux/heartburn   [] Abdominal pain Genitourinary:  [] Chronic kidney disease   [] Difficult urination  [] Frequent urination  [] Burning with urination   [] Hematuria Skin:  [] Rashes   [] Ulcers   [] Wounds Psychological:  [] History of anxiety   []  History of major depression.  Physical Examination  BP 133/74 (BP Location: Left Arm)   Pulse 69   Resp 16   Wt 209 lb 9.6 oz (95.1 kg)   BMI 30.07 kg/m  Gen:  WD/WN, NAD Head: [place]/AT, No temporalis wasting. Ear/Nose/Throat: Hearing grossly intact, nares w/o erythema or drainage Eyes: Conjunctiva clear. Sclera non-icteric Neck: Supple.  Trachea midline Pulmonary:  Good air movement, no use of accessory muscles.  Cardiac: RRR, no JVD Vascular:  Vessel Right Left  Radial Palpable Palpable                          DP 1+ Palpable 1+ Palpable  PT Not Palpable 1+ Palpable   Gastrointestinal: soft, non-tender/non-distended. No guarding/reflex.  Musculoskeletal: M/S 5/5 throughout.  No deformity or atrophy.  No edema. Neurologic: Sensation grossly intact in extremities.  Symmetrical.  Speech is fluent.  Psychiatric:  Judgment intact, Mood & affect appropriate for pt's clinical situation. Dermatologic: No rashes or ulcers noted.  No cellulitis or open wounds.      Labs Recent Results (from the past 2160 hour(s))  VAS [place] ABI WITH/WO TBI     Status: None   Collection Time: 02/27/23  9:41 AM  Result Value Ref Range   Right ABI .62    Left ABI .79   Glucose, capillary     Status: Abnormal   Collection Time: 03/15/23  7:44 AM  Result Value Ref Range  Glucose-Capillary 131 (H) 70 - 99 mg/dL    Comment: Glucose reference range applies only to samples taken after fasting for at least 8 hours.  BUN     Status: None   Collection Time: 03/15/23  7:46 AM  Result Value Ref Range   BUN 21 8 - 23 mg/dL    Comment: Performed at [facility], [address] [place], [place] [address]  Creatinine, serum     Status: None   Collection Time: 03/15/23  7:46 AM  Result Value Ref Range   Creatinine, Ser 1.22 0.61 - 1.24 mg/dL   GFR, Estimated >60 >45 mL/min    Comment: (NOTE) Calculated using the CKD-EPI Creatinine Equation (2021) Performed at [facility], [address] [place], [place] [address]     Radiology PERIPHERAL VASCULAR CATHETERIZATION  Result Date: 03/15/2023 See surgical note for result.   Assessment/Plan  Atherosclerosis of native arteries of extremity with rest pain [facility]) He had an angiogram a few weeks ago demonstrating dense calcific moderate stenosis of the left common femoral artery and right common femoral artery with complete occlusion caring down into the origins of the profundus and superficial femoral arteries.  The superficial femoral artery was fairly heavily diseased and had an occlusion down to near Hunter's canal where he reconstituted and was fairly normal with normal tibial runoff distally. He is scheduled for right common femoral endarterectomy and right SFA stent placement versus right femoral to popliteal bypass.  He also has significant left common  femoral disease with symptoms and desires to have this treated concomitantly with left common femoral endarterectomy which I think is very reasonable.  I have discussed the risks and benefits of the procedure with the patient today.  I have discussed the details and the performance of the procedure.  Patient voices his understanding and is agreeable to proceed with bilateral femoral endarterectomies, and right SFA stent versus right femoral to popliteal bypass.  Essential hypertension blood pressure control important in reducing the progression of atherosclerotic disease. On appropriate oral medications.       Diabetes mellitus type 2 with complications (HCC) blood glucose control important in reducing the progression of atherosclerotic disease. Also, involved in wound healing. On appropriate medications.     Festus Barren, MD  04/13/2023 11:59 AM    This note was created with Dragon medical transcription system.  Any errors from dictation are purely unintentional

## 2023-04-13 NOTE — Assessment & Plan Note (Signed)
He had an angiogram a few weeks ago demonstrating dense calcific moderate stenosis of the left common femoral artery and right common femoral artery with complete occlusion caring down into the origins of the profundus and superficial femoral arteries.  The superficial femoral artery was fairly heavily diseased and had an occlusion down to near Hunter's canal where he reconstituted and was fairly normal with normal tibial runoff distally. He is scheduled for right common femoral endarterectomy and right SFA stent placement versus right femoral to popliteal bypass.  He also has significant left common femoral disease with symptoms and desires to have this treated concomitantly with left common femoral endarterectomy which I think is very reasonable.  I have discussed the risks and benefits of the procedure with the patient today.  I have discussed the details and the performance of the procedure.  Patient voices his understanding and is agreeable to proceed with bilateral femoral endarterectomies, and right SFA stent versus right femoral to popliteal bypass.

## 2023-04-16 ENCOUNTER — Encounter
Admission: RE | Admit: 2023-04-16 | Discharge: 2023-04-16 | Disposition: A | Discharge status: Home / Self Care | Payer: Medicare HMO | Source: Ambulatory Visit | Attending: Vascular Surgery | Admitting: Vascular Surgery

## 2023-04-16 ENCOUNTER — Other Ambulatory Visit: Payer: Self-pay

## 2023-04-16 ENCOUNTER — Other Ambulatory Visit (INDEPENDENT_AMBULATORY_CARE_PROVIDER_SITE_OTHER): Payer: Self-pay | Admitting: Nurse Practitioner

## 2023-04-16 VITALS — Ht 70.0 in | Wt 207.5 lb

## 2023-04-16 DIAGNOSIS — Z01810 Encounter for preprocedural cardiovascular examination: Secondary | ICD-10-CM | POA: Diagnosis not present

## 2023-04-16 DIAGNOSIS — I700 Atherosclerosis of aorta: Secondary | ICD-10-CM

## 2023-04-16 DIAGNOSIS — E118 Type 2 diabetes mellitus with unspecified complications: Secondary | ICD-10-CM | POA: Insufficient documentation

## 2023-04-16 DIAGNOSIS — Z01812 Encounter for preprocedural laboratory examination: Secondary | ICD-10-CM

## 2023-04-16 DIAGNOSIS — I70221 Atherosclerosis of native arteries of extremities with rest pain, right leg: Secondary | ICD-10-CM

## 2023-04-16 DIAGNOSIS — Z01818 Encounter for other preprocedural examination: Secondary | ICD-10-CM | POA: Insufficient documentation

## 2023-04-16 HISTORY — DX: Atherosclerotic heart disease of native coronary artery without angina pectoris: I25.10

## 2023-04-16 HISTORY — DX: Type 2 diabetes mellitus with unspecified complications: E11.8

## 2023-04-16 HISTORY — DX: Unilateral post-traumatic osteoarthritis, right hip: M16.51

## 2023-04-16 HISTORY — DX: Atherosclerosis of native arteries of extremities with intermittent claudication, unspecified extremity: I70.219

## 2023-04-16 HISTORY — DX: Atherosclerosis of aorta: I70.0

## 2023-04-16 HISTORY — DX: Unspecified chronic bronchitis: J42

## 2023-04-16 HISTORY — DX: Atherosclerosis of native arteries of extremities with rest pain, unspecified extremity: I70.229

## 2023-04-16 LAB — BASIC METABOLIC PANEL
Anion gap: 8 (ref 5–15)
BUN: 22 mg/dL (ref 8–23)
CO2: 28 mmol/L (ref 22–32)
Calcium: 9.4 mg/dL (ref 8.9–10.3)
Chloride: 99 mmol/L (ref 98–111)
Creatinine, Ser: 1.02 mg/dL (ref 0.61–1.24)
GFR, Estimated: >60 mL/min (ref >60)
Glucose, Bld: 127 mg/dL — ABNORMAL HIGH (ref 70–99)
Potassium: 4 mmol/L (ref 3.5–5.1)
Sodium: 135 mmol/L (ref 135–145)

## 2023-04-16 LAB — CBC WITH DIFFERENTIAL/PLATELET
Abs Immature Granulocytes: 0.03 10*3/uL (ref 0.00–0.07)
Basophils Absolute: 0 10*3/uL (ref 0.0–0.1)
Basophils Relative: 1 %
Eosinophils Absolute: 0.2 10*3/uL (ref 0.0–0.5)
Eosinophils Relative: 3 %
HCT: 41.9 % (ref 39.0–52.0)
Hemoglobin: 14 g/dL (ref 13.0–17.0)
Immature Granulocytes: 0 %
Lymphocytes Relative: 26 %
Lymphs Abs: 2 10*3/uL (ref 0.7–4.0)
MCH: 31.3 pg (ref 26.0–34.0)
MCHC: 33.4 g/dL (ref 30.0–36.0)
MCV: 93.7 fL (ref 80.0–100.0)
Monocytes Absolute: 0.6 10*3/uL (ref 0.1–1.0)
Monocytes Relative: 8 %
Neutro Abs: 4.8 10*3/uL (ref 1.7–7.7)
Neutrophils Relative %: 62 %
Platelets: 199 10*3/uL (ref 150–400)
RBC: 4.47 MIL/uL (ref 4.22–5.81)
RDW: 14.1 % (ref 11.5–15.5)
WBC: 7.7 10*3/uL (ref 4.0–10.5)
nRBC: 0 % (ref 0.0–0.2)

## 2023-04-16 LAB — TYPE AND SCREEN
ABO/RH(D): O NEG
Antibody Screen: NEGATIVE

## 2023-04-16 NOTE — Patient Instructions (Addendum)
Your procedure is scheduled on:  Wednesday November 13  Report to the Registration Desk on the 1st floor of the [facility] To find out your arrival time, please call [phone] between 1PM - 3PM on:  Tuesday November 12 If your arrival time is 6:00 am, do not arrive before that time as the [facility] entrance doors do not open until 6:00 am.  REMEMBER: Instructions that are not followed completely may result in serious medical risk, up to and including death; or upon the discretion of your surgeon and anesthesiologist your surgery may need to be rescheduled.  Do not eat food after midnight the night before surgery.  No gum chewing or hard candies.  You may however, drink WATER up to 2 hours before you are scheduled to arrive for your surgery. Do not drink anything within 2 hours of your scheduled arrival time.  One week prior to surgery: Wednesday November 6  Stop Anti-inflammatories (NSAIDS) such as Advil, Aleve, Ibuprofen, Motrin, Naproxen, Naprosyn and Aspirin based products such as Excedrin, Goody's Powder, BC Powder.  Stop ANY OVER THE COUNTER supplements until after surgery.  You may however, continue to take Tylenol if needed for pain up until the day of surgery   Continue taking all of your other prescription medications up until the day of surgery.  ON THE DAY OF SURGERY ONLY TAKE THESE MEDICATIONS WITH SIPS OF WATER:  famotidine (PEPCID)    Use inhalers on the day of surgery and bring to the hospital. INCRUSE ELLIPTA  albuterol (VENTOLIN HFA)   No Alcohol for 24 hours before or after surgery.  No Smoking including e-cigarettes for 24 hours before surgery.  No chewable tobacco products for at least 6 hours before surgery.  No nicotine patches on the day of surgery.  Do not use any "recreational" drugs for at least a week (preferably 2 weeks) before your surgery.  Please be advised that the combination of cocaine and anesthesia may have negative outcomes, up to  and including death. If you test positive for cocaine, your surgery will be cancelled.  On the morning of surgery brush your teeth with toothpaste and water, you may rinse your mouth with mouthwash if you wish. Do not swallow any toothpaste or mouthwash.  Use CHG Soap as directed on instruction sheet.  Do not wear jewelry, make-up, hairpins, clips or nail polish.  For welded (permanent) jewelry: bracelets, anklets, waist bands, etc.  Please have this removed prior to surgery.  If it is not removed, there is a chance that hospital personnel will need to cut it off on the day of surgery.  Do not wear lotions, powders, or perfumes.   Do not shave body hair from the neck down 48 hours before surgery.  Contact lenses, hearing aids and dentures may not be worn into surgery.  Do not bring valuables to the hospital. [facility] is not responsible for any missing/lost belongings or valuables.   Notify your doctor if there is any change in your medical condition (cold, fever, infection).  Wear comfortable clothing (specific to your surgery type) to the hospital.  After surgery, you can help prevent lung complications by doing breathing exercises.  Take deep breaths and cough every 1-2 hours. Your doctor may order a device called an Incentive Spirometer to help you take deep breaths.  If you are being admitted to the hospital overnight, leave your suitcase in the car. After surgery it may be brought to your room.  In case of  increased patient census, it may be necessary for you, the patient, to continue your postoperative care in the Same Day Surgery department.  If you are being discharged the day of surgery, you will not be allowed to drive home. You will need a responsible individual to drive you home and stay with you for 24 hours after surgery.   If you are taking public transportation, you will need to have a responsible individual with you.  Please call the Pre-admissions Testing  Dept. at [phone] if you have any questions about these instructions.  Surgery Visitation Policy:  Patients having surgery or a procedure may have two visitors.  Children under the age of 49 must have an adult with them who is not the patient.  Inpatient Visitation:    Visiting hours are 7 a.m. to 8 p.m. Up to four visitors are allowed at one time in a patient room. The visitors may rotate out with other people during the day.  One visitor age 55 or older may stay with the patient overnight and must be in the room by 8 p.m.          Preparing for Surgery with CHLORHEXIDINE GLUCONATE (CHG) Soap  Chlorhexidine Gluconate (CHG) Soap  o An antiseptic cleaner that kills germs and bonds with the skin to continue killing germs even after washing  o Used for showering the night before surgery and morning of surgery  Before surgery, you can play an important role by reducing the number of germs on your skin.  CHG (Chlorhexidine gluconate) soap is an antiseptic cleanser which kills germs and bonds with the skin to continue killing germs even after washing.  Please do not use if you have an allergy to CHG or antibacterial soaps. If your skin becomes reddened/irritated stop using the CHG.  1. Shower the NIGHT BEFORE SURGERY and the MORNING OF SURGERY with CHG soap.  2. If you choose to wash your hair, wash your hair first as usual with your normal shampoo.  3. After shampooing, rinse your hair and body thoroughly to remove the shampoo.  4. Use CHG as you would any other liquid soap. You can apply CHG directly to the skin and wash gently with a scrungie or a clean washcloth.  5. Apply the CHG soap to your body only from the neck down. Do not use on open wounds or open sores. Avoid contact with your eyes, ears, mouth, and genitals (private parts). Wash face and genitals (private parts) with your normal soap.  6. Wash thoroughly, paying special attention to the area where your  surgery will be performed.  7. Thoroughly rinse your body with warm water.  8. Do not shower/wash with your normal soap after using and rinsing off the CHG soap.  9. Pat yourself dry with a clean towel.  10. Wear clean pajamas to bed the night before surgery.  12. Place clean sheets on your bed the night of your first shower and do not sleep with pets.  13. Shower again with the CHG soap on the day of surgery prior to arriving at the hospital.  14. Do not apply any deodorants/lotions/powders.  15. Please wear clean clothes to the hospital.

## 2023-04-18 ENCOUNTER — Inpatient Hospital Stay: Payer: Medicare HMO

## 2023-04-18 ENCOUNTER — Other Ambulatory Visit: Payer: Self-pay

## 2023-04-18 ENCOUNTER — Encounter: Payer: Self-pay | Admitting: Vascular Surgery

## 2023-04-18 ENCOUNTER — Encounter
Admission: RE | Disposition: A | Discharge status: Home / Self Care | Payer: Self-pay | Source: Home / Self Care | Attending: Vascular Surgery

## 2023-04-18 ENCOUNTER — Inpatient Hospital Stay: Payer: Medicare HMO | Admitting: Urgent Care

## 2023-04-18 ENCOUNTER — Inpatient Hospital Stay: Payer: Self-pay

## 2023-04-18 ENCOUNTER — Inpatient Hospital Stay
Admission: RE | Admit: 2023-04-18 | Discharge: 2023-04-23 | DRG: 253 | Disposition: A | Discharge status: Home Health | Payer: Medicare HMO | Attending: Vascular Surgery | Admitting: Vascular Surgery

## 2023-04-18 DIAGNOSIS — I70221 Atherosclerosis of native arteries of extremities with rest pain, right leg: Secondary | ICD-10-CM | POA: Diagnosis present

## 2023-04-18 DIAGNOSIS — Z7982 Long term (current) use of aspirin: Secondary | ICD-10-CM | POA: Diagnosis not present

## 2023-04-18 DIAGNOSIS — I10 Essential (primary) hypertension: Secondary | ICD-10-CM | POA: Diagnosis present

## 2023-04-18 DIAGNOSIS — I700 Atherosclerosis of aorta: Secondary | ICD-10-CM | POA: Diagnosis present

## 2023-04-18 DIAGNOSIS — F17210 Nicotine dependence, cigarettes, uncomplicated: Secondary | ICD-10-CM | POA: Diagnosis present

## 2023-04-18 DIAGNOSIS — Z79899 Other long term (current) drug therapy: Secondary | ICD-10-CM | POA: Diagnosis not present

## 2023-04-18 DIAGNOSIS — Z96641 Presence of right artificial hip joint: Secondary | ICD-10-CM | POA: Diagnosis present

## 2023-04-18 DIAGNOSIS — E1151 Type 2 diabetes mellitus with diabetic peripheral angiopathy without gangrene: Principal | ICD-10-CM | POA: Diagnosis present

## 2023-04-18 DIAGNOSIS — I70212 Atherosclerosis of native arteries of extremities with intermittent claudication, left leg: Secondary | ICD-10-CM | POA: Diagnosis present

## 2023-04-18 DIAGNOSIS — Z9889 Other specified postprocedural states: Secondary | ICD-10-CM | POA: Diagnosis not present

## 2023-04-18 DIAGNOSIS — I70223 Atherosclerosis of native arteries of extremities with rest pain, bilateral legs: Secondary | ICD-10-CM

## 2023-04-18 DIAGNOSIS — Z01818 Encounter for other preprocedural examination: Secondary | ICD-10-CM | POA: Diagnosis not present

## 2023-04-18 DIAGNOSIS — K219 Gastro-esophageal reflux disease without esophagitis: Secondary | ICD-10-CM | POA: Diagnosis present

## 2023-04-18 DIAGNOSIS — Z7902 Long term (current) use of antithrombotics/antiplatelets: Secondary | ICD-10-CM | POA: Diagnosis not present

## 2023-04-18 DIAGNOSIS — I7092 Chronic total occlusion of artery of the extremities: Secondary | ICD-10-CM | POA: Diagnosis present

## 2023-04-18 DIAGNOSIS — I2510 Atherosclerotic heart disease of native coronary artery without angina pectoris: Secondary | ICD-10-CM | POA: Diagnosis present

## 2023-04-18 DIAGNOSIS — E118 Type 2 diabetes mellitus with unspecified complications: Secondary | ICD-10-CM

## 2023-04-18 DIAGNOSIS — I70229 Atherosclerosis of native arteries of extremities with rest pain, unspecified extremity: Secondary | ICD-10-CM | POA: Diagnosis present

## 2023-04-18 DIAGNOSIS — Z01812 Encounter for preprocedural laboratory examination: Secondary | ICD-10-CM

## 2023-04-18 DIAGNOSIS — Z809 Family history of malignant neoplasm, unspecified: Secondary | ICD-10-CM

## 2023-04-18 HISTORY — PX: ENDARTERECTOMY FEMORAL: SHX5804

## 2023-04-18 LAB — GLUCOSE, CAPILLARY
Glucose-Capillary: 136 mg/dL — ABNORMAL HIGH (ref 70–99)
Glucose-Capillary: 189 mg/dL — ABNORMAL HIGH (ref 70–99)
Glucose-Capillary: 203 mg/dL — ABNORMAL HIGH (ref 70–99)

## 2023-04-18 LAB — CBC
HCT: 35.6 % — ABNORMAL LOW (ref 39.0–52.0)
Hemoglobin: 11.7 g/dL — ABNORMAL LOW (ref 13.0–17.0)
MCH: 31.8 pg (ref 26.0–34.0)
MCHC: 32.9 g/dL (ref 30.0–36.0)
MCV: 96.7 fL (ref 80.0–100.0)
Platelets: 162 10*3/uL (ref 150–400)
RBC: 3.68 MIL/uL — ABNORMAL LOW (ref 4.22–5.81)
RDW: 14.2 % (ref 11.5–15.5)
WBC: 11.8 10*3/uL — ABNORMAL HIGH (ref 4.0–10.5)
nRBC: 0 % (ref 0.0–0.2)

## 2023-04-18 LAB — MRSA NEXT GEN BY PCR, NASAL: MRSA by PCR Next Gen: NOT DETECTED

## 2023-04-18 LAB — CREATININE, SERUM
Creatinine, Ser: 1.26 mg/dL — ABNORMAL HIGH (ref 0.61–1.24)
GFR, Estimated: >60 mL/min (ref >60)

## 2023-04-18 SURGERY — ENDARTERECTOMY, FEMORAL
Anesthesia: General | Site: Leg Upper | Laterality: Bilateral

## 2023-04-18 MED ORDER — FENTANYL CITRATE (PF) 100 MCG/2ML IJ SOLN: 25.0000 ug | INTRAMUSCULAR | Status: DC | PRN

## 2023-04-18 MED ORDER — SODIUM CHLORIDE 0.9 % IV SOLN: INTRAVENOUS | Status: DC

## 2023-04-18 MED ORDER — ONDANSETRON HCL 4 MG/2ML IJ SOLN
4.0000 mg | Freq: Four times a day (QID) | INTRAMUSCULAR | Status: DC | PRN
Start: 2023-04-18 — End: 2023-04-23
  Filled 2023-04-18: qty 2

## 2023-04-18 MED ORDER — HYDRALAZINE HCL 20 MG/ML IJ SOLN: 5.0000 mg | INTRAMUSCULAR | Status: DC | PRN

## 2023-04-18 MED ORDER — PROPOFOL 10 MG/ML IV BOLUS
INTRAVENOUS | Status: AC
  Filled 2023-04-18: qty 20

## 2023-04-18 MED ORDER — HEMOSTATIC AGENTS (NO CHARGE) OPTIME
TOPICAL | Status: DC | PRN
  Administered 2023-04-18: 1 via TOPICAL

## 2023-04-18 MED ORDER — LOSARTAN POTASSIUM 50 MG PO TABS
100.0000 mg | ORAL_TABLET | Freq: Every day | ORAL | Status: DC
  Administered 2023-04-20 – 2023-04-23 (×3): 100 mg via ORAL
  Filled 2023-04-18 (×4): qty 2

## 2023-04-18 MED ORDER — IODIXANOL 320 MG/ML IV SOLN
INTRAVENOUS | Status: DC | PRN
  Administered 2023-04-18: 70 mL via INTRA_ARTERIAL

## 2023-04-18 MED ORDER — MORPHINE SULFATE (PF) 2 MG/ML IV SOLN
2.0000 mg | INTRAVENOUS | Status: DC | PRN
  Administered 2023-04-18 – 2023-04-20 (×9): 2 mg via INTRAVENOUS
  Filled 2023-04-18 (×10): qty 1

## 2023-04-18 MED ORDER — NICOTINE 14 MG/24HR TD PT24
14.0000 mg | MEDICATED_PATCH | Freq: Every day | TRANSDERMAL | Status: DC
  Administered 2023-04-18 – 2023-04-23 (×6): 14 mg via TRANSDERMAL
  Filled 2023-04-18 (×6): qty 1

## 2023-04-18 MED ORDER — ORAL CARE MOUTH RINSE: 15.0000 mL | OROMUCOSAL | Status: DC | PRN

## 2023-04-18 MED ORDER — UMECLIDINIUM BROMIDE 62.5 MCG/ACT IN AEPB
1.0000 | INHALATION_SPRAY | Freq: Every day | RESPIRATORY_TRACT | Status: DC
  Administered 2023-04-18 – 2023-04-23 (×6): 1 via RESPIRATORY_TRACT
  Filled 2023-04-18: qty 7

## 2023-04-18 MED ORDER — ALUM & MAG HYDROXIDE-SIMETH 200-200-20 MG/5ML PO SUSP
15.0000 mL | ORAL | Status: DC | PRN
Start: 2023-04-18 — End: 2023-04-23

## 2023-04-18 MED ORDER — HEPARIN SODIUM (PORCINE) 1000 UNIT/ML IJ SOLN
INTRAMUSCULAR | Status: AC
  Filled 2023-04-18: qty 10

## 2023-04-18 MED ORDER — ROCURONIUM BROMIDE 10 MG/ML (PF) SYRINGE
PREFILLED_SYRINGE | INTRAVENOUS | Status: AC
  Filled 2023-04-18: qty 10

## 2023-04-18 MED ORDER — ACETAMINOPHEN 325 MG PO TABS: 325.0000 mg | ORAL_TABLET | Freq: Four times a day (QID) | ORAL | Status: DC | PRN

## 2023-04-18 MED ORDER — SODIUM CHLORIDE 0.9 % IV SOLN
500.0000 mL | Freq: Once | INTRAVENOUS | Status: AC | PRN
  Administered 2023-04-18: 500 mL via INTRAVENOUS

## 2023-04-18 MED ORDER — DEXAMETHASONE SODIUM PHOSPHATE 10 MG/ML IJ SOLN
INTRAMUSCULAR | Status: DC | PRN
  Administered 2023-04-18: 10 mg via INTRAVENOUS

## 2023-04-18 MED ORDER — METOPROLOL TARTRATE 5 MG/5ML IV SOLN: 2.0000 mg | INTRAVENOUS | Status: DC | PRN

## 2023-04-18 MED ORDER — PHENYLEPHRINE HCL-NACL 20-0.9 MG/250ML-% IV SOLN
INTRAVENOUS | Status: DC | PRN
  Administered 2023-04-18: 40 ug/min via INTRAVENOUS

## 2023-04-18 MED ORDER — PHENOL 1.4 % MT LIQD: 1.0000 | OROMUCOSAL | Status: DC | PRN

## 2023-04-18 MED ORDER — ORAL CARE MOUTH RINSE: 15.0000 mL | Freq: Once | OROMUCOSAL | Status: AC

## 2023-04-18 MED ORDER — GENTAMICIN SULFATE 40 MG/ML IJ SOLN
INTRAMUSCULAR | Status: DC | PRN
  Administered 2023-04-18: 80 mg

## 2023-04-18 MED ORDER — MIDAZOLAM HCL 2 MG/2ML IJ SOLN
INTRAMUSCULAR | Status: DC | PRN
  Administered 2023-04-18: 2 mg via INTRAVENOUS

## 2023-04-18 MED ORDER — LACTATED RINGERS IV SOLN: INTRAVENOUS | Status: DC | PRN

## 2023-04-18 MED ORDER — HYDROCHLOROTHIAZIDE 12.5 MG PO TABS
12.5000 mg | ORAL_TABLET | Freq: Every day | ORAL | Status: DC
  Administered 2023-04-22 – 2023-04-23 (×2): 12.5 mg via ORAL
  Filled 2023-04-18 (×6): qty 1

## 2023-04-18 MED ORDER — HEPARIN 30,000 UNITS/1000 ML (OHS) CELLSAVER SOLUTION
Status: AC
  Filled 2023-04-18: qty 1000

## 2023-04-18 MED ORDER — CHLORHEXIDINE GLUCONATE 0.12 % MT SOLN
OROMUCOSAL | Status: AC
  Filled 2023-04-18: qty 15

## 2023-04-18 MED ORDER — OXYCODONE-ACETAMINOPHEN 5-325 MG PO TABS
1.0000 | ORAL_TABLET | ORAL | Status: DC | PRN
  Administered 2023-04-18 – 2023-04-19 (×2): 1 via ORAL
  Administered 2023-04-19 – 2023-04-20 (×3): 2 via ORAL
  Administered 2023-04-20 (×2): 1 via ORAL
  Administered 2023-04-21: 2 via ORAL
  Administered 2023-04-21: 1 via ORAL
  Administered 2023-04-21: 2 via ORAL
  Administered 2023-04-22: 1 via ORAL
  Administered 2023-04-22: 2 via ORAL
  Administered 2023-04-23: 1 via ORAL
  Filled 2023-04-18: qty 2
  Filled 2023-04-18: qty 1
  Filled 2023-04-18: qty 2
  Filled 2023-04-18: qty 1
  Filled 2023-04-18: qty 2
  Filled 2023-04-18 (×2): qty 1
  Filled 2023-04-18 (×6): qty 2
  Filled 2023-04-18: qty 1

## 2023-04-18 MED ORDER — ASPIRIN 81 MG PO TBEC
81.0000 mg | DELAYED_RELEASE_TABLET | Freq: Every day | ORAL | Status: DC
  Administered 2023-04-18 – 2023-04-23 (×6): 81 mg via ORAL
  Filled 2023-04-18 (×6): qty 1

## 2023-04-18 MED ORDER — GENTAMICIN SULFATE 40 MG/ML IJ SOLN
INTRAMUSCULAR | Status: AC
  Filled 2023-04-18: qty 2

## 2023-04-18 MED ORDER — PHENYLEPHRINE 80 MCG/ML (10ML) SYRINGE FOR IV PUSH (FOR BLOOD PRESSURE SUPPORT)
PREFILLED_SYRINGE | INTRAVENOUS | Status: DC | PRN
  Administered 2023-04-18: 200 ug via INTRAVENOUS
  Administered 2023-04-18: 100 ug via INTRAVENOUS
  Administered 2023-04-18: 200 ug via INTRAVENOUS

## 2023-04-18 MED ORDER — CHLORHEXIDINE GLUCONATE CLOTH 2 % EX PADS
6.0000 | MEDICATED_PAD | Freq: Once | CUTANEOUS | Status: AC
  Administered 2023-04-18: 6 via TOPICAL

## 2023-04-18 MED ORDER — SORBITOL 70 % SOLN
30.0000 mL | Freq: Every day | Status: DC | PRN
Start: 2023-04-18 — End: 2023-04-23

## 2023-04-18 MED ORDER — HEPARIN 30,000 UNITS/1000 ML (OHS) CELLSAVER SOLUTION
Status: AC | PRN
  Administered 2023-04-18: 1

## 2023-04-18 MED ORDER — ALBUTEROL SULFATE HFA 108 (90 BASE) MCG/ACT IN AERS
INHALATION_SPRAY | RESPIRATORY_TRACT | Status: DC | PRN
  Administered 2023-04-18: 2 via RESPIRATORY_TRACT
  Administered 2023-04-18: 4 via RESPIRATORY_TRACT

## 2023-04-18 MED ORDER — DOCUSATE SODIUM 100 MG PO CAPS
100.0000 mg | ORAL_CAPSULE | Freq: Every day | ORAL | Status: DC
  Administered 2023-04-20 – 2023-04-23 (×3): 100 mg via ORAL
  Filled 2023-04-18 (×6): qty 1

## 2023-04-18 MED ORDER — ROCURONIUM BROMIDE 100 MG/10ML IV SOLN
INTRAVENOUS | Status: DC | PRN
  Administered 2023-04-18: 60 mg via INTRAVENOUS
  Administered 2023-04-18: 10 mg via INTRAVENOUS
  Administered 2023-04-18: 20 mg via INTRAVENOUS
  Administered 2023-04-18: 10 mg via INTRAVENOUS
  Administered 2023-04-18: 20 mg via INTRAVENOUS

## 2023-04-18 MED ORDER — ACETAMINOPHEN 650 MG RE SUPP: 325.0000 mg | RECTAL | Status: DC | PRN

## 2023-04-18 MED ORDER — AMLODIPINE BESYLATE 5 MG PO TABS
5.0000 mg | ORAL_TABLET | Freq: Every day | ORAL | Status: DC
  Administered 2023-04-20 – 2023-04-23 (×3): 5 mg via ORAL
  Filled 2023-04-18 (×5): qty 1

## 2023-04-18 MED ORDER — HEPARIN SODIUM (PORCINE) 5000 UNIT/ML IJ SOLN
INTRAMUSCULAR | Status: AC
  Filled 2023-04-18: qty 1

## 2023-04-18 MED ORDER — HYDROMORPHONE HCL 1 MG/ML IJ SOLN
1.0000 mg | Freq: Once | INTRAMUSCULAR | Status: AC | PRN
  Administered 2023-04-18: 1 mg via INTRAVENOUS
  Filled 2023-04-18: qty 1

## 2023-04-18 MED ORDER — ONDANSETRON HCL 4 MG/2ML IJ SOLN: 4.0000 mg | Freq: Four times a day (QID) | INTRAMUSCULAR | Status: DC | PRN

## 2023-04-18 MED ORDER — FAMOTIDINE IN NACL 20-0.9 MG/50ML-% IV SOLN
20.0000 mg | Freq: Two times a day (BID) | INTRAVENOUS | Status: DC
  Administered 2023-04-18 – 2023-04-19 (×3): 20 mg via INTRAVENOUS
  Filled 2023-04-18 (×3): qty 50

## 2023-04-18 MED ORDER — CHLORHEXIDINE GLUCONATE 0.12 % MT SOLN
15.0000 mL | Freq: Once | OROMUCOSAL | Status: AC
  Administered 2023-04-18: 15 mL via OROMUCOSAL

## 2023-04-18 MED ORDER — SENNOSIDES-DOCUSATE SODIUM 8.6-50 MG PO TABS: 1.0000 | ORAL_TABLET | Freq: Every evening | ORAL | Status: DC | PRN

## 2023-04-18 MED ORDER — SEVOFLURANE IN SOLN
RESPIRATORY_TRACT | Status: AC
  Filled 2023-04-18: qty 250

## 2023-04-18 MED ORDER — VISTASEAL 10 ML SINGLE DOSE KIT
PACK | CUTANEOUS | Status: DC | PRN
  Administered 2023-04-18: 10 mL via TOPICAL

## 2023-04-18 MED ORDER — VISTASEAL 10 ML SINGLE DOSE KIT
PACK | CUTANEOUS | Status: AC
  Filled 2023-04-18: qty 10

## 2023-04-18 MED ORDER — FENTANYL CITRATE (PF) 100 MCG/2ML IJ SOLN
INTRAMUSCULAR | Status: AC
  Filled 2023-04-18: qty 2

## 2023-04-18 MED ORDER — PROPOFOL 10 MG/ML IV BOLUS
INTRAVENOUS | Status: DC | PRN
  Administered 2023-04-18: 200 mg via INTRAVENOUS
  Administered 2023-04-18: 30 mg via INTRAVENOUS

## 2023-04-18 MED ORDER — ALBUTEROL SULFATE (2.5 MG/3ML) 0.083% IN NEBU
2.5000 mg | INHALATION_SOLUTION | Freq: Four times a day (QID) | RESPIRATORY_TRACT | Status: DC | PRN
  Administered 2023-04-22: 2.5 mg via RESPIRATORY_TRACT
  Filled 2023-04-18: qty 3

## 2023-04-18 MED ORDER — CEFAZOLIN SODIUM-DEXTROSE 2-4 GM/100ML-% IV SOLN
2.0000 g | Freq: Three times a day (TID) | INTRAVENOUS | Status: AC
  Administered 2023-04-18 – 2023-04-19 (×2): 2 g via INTRAVENOUS
  Filled 2023-04-18 (×2): qty 100

## 2023-04-18 MED ORDER — ROSUVASTATIN CALCIUM 20 MG PO TABS
20.0000 mg | ORAL_TABLET | Freq: Every day | ORAL | Status: DC
  Administered 2023-04-18 – 2023-04-23 (×6): 20 mg via ORAL
  Filled 2023-04-18 (×2): qty 1
  Filled 2023-04-18: qty 2
  Filled 2023-04-18 (×2): qty 1
  Filled 2023-04-18: qty 2

## 2023-04-18 MED ORDER — NITROGLYCERIN IN D5W 200-5 MCG/ML-% IV SOLN: 5.0000 ug/min | INTRAVENOUS | Status: DC

## 2023-04-18 MED ORDER — SODIUM CHLORIDE 0.9 % IV BOLUS
500.0000 mL | Freq: Once | INTRAVENOUS | Status: AC
  Administered 2023-04-18: 500 mL via INTRAVENOUS

## 2023-04-18 MED ORDER — LOSARTAN POTASSIUM-HCTZ 100-12.5 MG PO TABS: 1.0000 | ORAL_TABLET | Freq: Every day | ORAL | Status: DC

## 2023-04-18 MED ORDER — MAGNESIUM SULFATE 2 GM/50ML IV SOLN: 2.0000 g | Freq: Every day | INTRAVENOUS | Status: DC | PRN

## 2023-04-18 MED ORDER — LABETALOL HCL 5 MG/ML IV SOLN: 10.0000 mg | INTRAVENOUS | Status: DC | PRN

## 2023-04-18 MED ORDER — ONDANSETRON HCL 4 MG/2ML IJ SOLN
INTRAMUSCULAR | Status: AC
  Filled 2023-04-18: qty 2

## 2023-04-18 MED ORDER — STERILE WATER FOR IRRIGATION IR SOLN
Status: DC | PRN
  Administered 2023-04-18: 1000 mL

## 2023-04-18 MED ORDER — HEPARIN SODIUM (PORCINE) 1000 UNIT/ML IJ SOLN
INTRAMUSCULAR | Status: DC | PRN
  Administered 2023-04-18: 7000 [IU] via INTRAVENOUS
  Administered 2023-04-18: 2000 [IU] via INTRAVENOUS

## 2023-04-18 MED ORDER — GUAIFENESIN-DM 100-10 MG/5ML PO SYRP: 15.0000 mL | ORAL_SOLUTION | ORAL | Status: DC | PRN

## 2023-04-18 MED ORDER — ALBUMIN HUMAN 5 % IV SOLN: INTRAVENOUS | Status: DC | PRN

## 2023-04-18 MED ORDER — FENTANYL CITRATE (PF) 100 MCG/2ML IJ SOLN
INTRAMUSCULAR | Status: DC | PRN
  Administered 2023-04-18 (×3): 50 ug via INTRAVENOUS

## 2023-04-18 MED ORDER — DEXAMETHASONE SODIUM PHOSPHATE 10 MG/ML IJ SOLN
INTRAMUSCULAR | Status: AC
  Filled 2023-04-18: qty 1

## 2023-04-18 MED ORDER — ACETAMINOPHEN 325 MG PO TABS
325.0000 mg | ORAL_TABLET | ORAL | Status: DC | PRN
Start: 2023-04-18 — End: 2023-04-23
  Filled 2023-04-18: qty 2

## 2023-04-18 MED ORDER — ENOXAPARIN SODIUM 40 MG/0.4ML IJ SOSY
40.0000 mg | PREFILLED_SYRINGE | INTRAMUSCULAR | Status: DC
  Administered 2023-04-19 – 2023-04-23 (×5): 40 mg via SUBCUTANEOUS
  Filled 2023-04-18 (×5): qty 0.4

## 2023-04-18 MED ORDER — CEFAZOLIN SODIUM-DEXTROSE 2-4 GM/100ML-% IV SOLN
INTRAVENOUS | Status: AC
  Filled 2023-04-18: qty 100

## 2023-04-18 MED ORDER — LIDOCAINE HCL (CARDIAC) PF 100 MG/5ML IV SOSY
PREFILLED_SYRINGE | INTRAVENOUS | Status: DC | PRN
  Administered 2023-04-18: 100 mg via INTRAVENOUS

## 2023-04-18 MED ORDER — ALBUMIN HUMAN 5 % IV SOLN
INTRAVENOUS | Status: AC
  Filled 2023-04-18: qty 250

## 2023-04-18 MED ORDER — DOPAMINE-DEXTROSE 3.2-5 MG/ML-% IV SOLN
3.0000 ug/kg/min | INTRAVENOUS | Status: DC
  Administered 2023-04-18: 3 ug/kg/min via INTRAVENOUS
  Filled 2023-04-18: qty 250

## 2023-04-18 MED ORDER — ONDANSETRON HCL 4 MG/2ML IJ SOLN
INTRAMUSCULAR | Status: DC | PRN
  Administered 2023-04-18: 4 mg via INTRAVENOUS

## 2023-04-18 MED ORDER — ACETAMINOPHEN 10 MG/ML IV SOLN
INTRAVENOUS | Status: DC | PRN
  Administered 2023-04-18: 1000 mg via INTRAVENOUS

## 2023-04-18 MED ORDER — MIDAZOLAM HCL 2 MG/2ML IJ SOLN
INTRAMUSCULAR | Status: AC
  Filled 2023-04-18: qty 2

## 2023-04-18 MED ORDER — SUGAMMADEX SODIUM 200 MG/2ML IV SOLN
INTRAVENOUS | Status: DC | PRN
  Administered 2023-04-18: 400 mg via INTRAVENOUS

## 2023-04-18 MED ORDER — OXYCODONE HCL 5 MG/5ML PO SOLN: 5.0000 mg | Freq: Once | ORAL | Status: DC | PRN

## 2023-04-18 MED ORDER — VANCOMYCIN HCL 1000 MG IV SOLR
INTRAVENOUS | Status: DC | PRN
  Administered 2023-04-18: 1000 mg

## 2023-04-18 MED ORDER — VANCOMYCIN HCL 1000 MG IV SOLR
INTRAVENOUS | Status: AC
  Filled 2023-04-18: qty 20

## 2023-04-18 MED ORDER — CLOPIDOGREL BISULFATE 75 MG PO TABS
75.0000 mg | ORAL_TABLET | Freq: Every day | ORAL | Status: DC
  Administered 2023-04-19 – 2023-04-23 (×5): 75 mg via ORAL
  Filled 2023-04-18 (×5): qty 1

## 2023-04-18 MED ORDER — EPHEDRINE SULFATE-NACL 50-0.9 MG/10ML-% IV SOSY
PREFILLED_SYRINGE | INTRAVENOUS | Status: DC | PRN
  Administered 2023-04-18: 5 mg via INTRAVENOUS
  Administered 2023-04-18 (×2): 10 mg via INTRAVENOUS

## 2023-04-18 MED ORDER — OXYCODONE HCL 5 MG PO TABS: 5.0000 mg | ORAL_TABLET | Freq: Once | ORAL | Status: DC | PRN

## 2023-04-18 MED ORDER — POTASSIUM CHLORIDE CRYS ER 20 MEQ PO TBCR: 20.0000 meq | EXTENDED_RELEASE_TABLET | Freq: Every day | ORAL | Status: DC | PRN

## 2023-04-18 MED ORDER — CEFAZOLIN SODIUM-DEXTROSE 2-4 GM/100ML-% IV SOLN
2.0000 g | INTRAVENOUS | Status: AC
  Administered 2023-04-18 (×2): 2 g via INTRAVENOUS

## 2023-04-18 MED ORDER — CHLORHEXIDINE GLUCONATE CLOTH 2 % EX PADS
6.0000 | MEDICATED_PAD | Freq: Every day | CUTANEOUS | Status: DC
  Administered 2023-04-18 – 2023-04-23 (×6): 6 via TOPICAL

## 2023-04-18 SURGICAL SUPPLY — 79 items
5 PAIRS OF YELLOW SUTURE CLAMP (MISCELLANEOUS) ×1
BAG DECANTER FOR FLEXI CONT (MISCELLANEOUS) ×1 IMPLANT
BAG ISOLATATION DRAPE 20X20 ST (DRAPES) IMPLANT
BALLN LUTONIX 018 5X300X130 (BALLOONS) ×1
BALLN LUTONIX DCB 7X60X130 (BALLOONS) ×1
BALLN ULTRVRSE 3X220X150 (BALLOONS) ×1
BALLOON LUTONIX 018 5X300X130 (BALLOONS) IMPLANT
BALLOON LUTONIX DCB 7X60X130 (BALLOONS) IMPLANT
BALLOON ULTRVRSE 3X220X150 (BALLOONS) IMPLANT
BLADE SURG 15 STRL LF DISP TIS (BLADE) ×1 IMPLANT
BLADE SURG 15 STRL SS (BLADE) ×1
BLADE SURG SZ11 CARB STEEL (BLADE) ×1 IMPLANT
BRUSH SCRUB EZ 4% CHG (MISCELLANEOUS) ×1 IMPLANT
CATH BEACON 5 .035 65 KMP TIP (CATHETERS) IMPLANT
CATH SLIP 5FR 0.38 X 40 KMP (CATHETERS) IMPLANT
CHLORAPREP W/TINT 26 (MISCELLANEOUS) ×1 IMPLANT
CLAMP SUTURE YELLOW 5 PAIRS (MISCELLANEOUS) ×1 IMPLANT
DEVICE PRESTO INFLATION (MISCELLANEOUS) IMPLANT
DRAPE INCISE IOBAN 66X45 STRL (DRAPES) ×1 IMPLANT
ELECT CAUTERY BLADE 6.4 (BLADE) ×1 IMPLANT
ELECT REM PT RETURN 9FT ADLT (ELECTROSURGICAL) ×1
ELECTRODE REM PT RTRN 9FT ADLT (ELECTROSURGICAL) ×1 IMPLANT
GLIDEWIRE ADV .035X180CM (WIRE) IMPLANT
GLOVE BIO SURGEON STRL SZ7 (GLOVE) ×3 IMPLANT
GOWN STRL REUS W/ TWL LRG LVL3 (GOWN DISPOSABLE) ×2 IMPLANT
GOWN STRL REUS W/ TWL XL LVL3 (GOWN DISPOSABLE) ×1 IMPLANT
GOWN STRL REUS W/TWL 2XL LVL3 (GOWN DISPOSABLE) ×1 IMPLANT
GOWN STRL REUS W/TWL LRG LVL3 (GOWN DISPOSABLE) ×2
GOWN STRL REUS W/TWL XL LVL3 (GOWN DISPOSABLE) ×2
GRAFT VASC PATCH XENOSURE 1X14 (Vascular Products) IMPLANT
IV NS 500ML (IV SOLUTION) ×1
IV NS 500ML BAXH (IV SOLUTION) ×1 IMPLANT
KIT PREVENA INCISION MGT 13 (CANNISTER) IMPLANT
KIT STIMULAN RAPID CURE 5CC (Orthopedic Implant) IMPLANT
KIT TURNOVER KIT A (KITS) ×1 IMPLANT
LABEL OR SOLS (LABEL) ×1 IMPLANT
LOOP VESSEL MAXI 1X406 RED (MISCELLANEOUS) ×2 IMPLANT
LOOP VESSEL MINI 0.8X406 BLUE (MISCELLANEOUS) ×2 IMPLANT
MANIFOLD NEPTUNE II (INSTRUMENTS) ×1 IMPLANT
NDL SAFETY ECLIPSE 18X1.5 (NEEDLE) ×1 IMPLANT
NS IRRIG 500ML POUR BTL (IV SOLUTION) ×1 IMPLANT
PACK BASIN MAJOR ARMC (MISCELLANEOUS) ×1 IMPLANT
PACK UNIVERSAL (MISCELLANEOUS) ×1 IMPLANT
SET WALTER ACTIVATION W/DRAPE (SET/KITS/TRAYS/PACK) ×1 IMPLANT
SHEATH BRITE TIP 7FRX11 (SHEATH) IMPLANT
SPONGE T-LAP 18X18 ~~LOC~~+RFID (SPONGE) ×2 IMPLANT
STAPLER SKIN PROX 35W (STAPLE) ×1 IMPLANT
STENT VIABAHN 7X100X120 (Permanent Stent) IMPLANT
STENT VIABAHN 7X25X120 (Permanent Stent) IMPLANT
STENT VIABAHN 8X50X120 (Permanent Stent) IMPLANT
STENT VIABAHN25X120X7X (Permanent Stent) ×1 IMPLANT
STENT VIABAHN5X120X8X (Permanent Stent) ×1 IMPLANT
SUT ETHILON 3-0 FS-10 30 BLK (SUTURE) ×2
SUT PROLENE 5 0 RB 1 DA (SUTURE) ×2 IMPLANT
SUT PROLENE 5-0 (SUTURE) ×2
SUT PROLENE 5-0 BB 36X2 ARM (SUTURE) ×2
SUT PROLENE 6 0 BV (SUTURE) ×4 IMPLANT
SUT PROLENE 7 0 BV 1 (SUTURE) ×2 IMPLANT
SUT SILK 2 0 (SUTURE) ×1
SUT SILK 2-0 18XBRD TIE 12 (SUTURE) ×1 IMPLANT
SUT SILK 3 0 (SUTURE) ×1
SUT SILK 3-0 18XBRD TIE 12 (SUTURE) ×1 IMPLANT
SUT SILK 4 0 (SUTURE) ×1
SUT SILK 4-0 18XBRD TIE 12 (SUTURE) ×1 IMPLANT
SUT VIC AB 2-0 CT1 27 (SUTURE) ×4
SUT VIC AB 2-0 CT1 TAPERPNT 27 (SUTURE) ×2 IMPLANT
SUT VIC AB 3-0 SH 27 (SUTURE) ×6
SUT VIC AB 3-0 SH 27X BRD (SUTURE) ×1 IMPLANT
SUT VICRYL+ 3-0 36IN CT-1 (SUTURE) ×2 IMPLANT
SUTURE EHLN 3-0 FS-10 30 BLK (SUTURE) IMPLANT
SUTURE PROLEN 5-0 BB 36X2 ARM (SUTURE) IMPLANT
SYR 20ML LL LF (SYRINGE) ×1 IMPLANT
SYR 5ML LL (SYRINGE) ×1 IMPLANT
TAG SUTURE CLAMP YLW 5PR (MISCELLANEOUS) ×1
TRAP FLUID SMOKE EVACUATOR (MISCELLANEOUS) ×1 IMPLANT
TRAY FOLEY MTR SLVR 16FR STAT (SET/KITS/TRAYS/PACK) ×1 IMPLANT
WATER STERILE IRR 500ML POUR (IV SOLUTION) ×1 IMPLANT
WIRE G V18X300CM (WIRE) IMPLANT
WIRE GUIDERIGHT .035X150 (WIRE) IMPLANT

## 2023-04-18 NOTE — Transfer of Care (Signed)
Immediate Anesthesia Transfer of Care Note  Patient: Phillip Mendez  Procedure(s) Performed: ENDARTERECTOMY FEMORAL (RIGHT SFA STENT) (POSSIBLE RIGHT FEM-POP BYPASS) (Bilateral: Leg Upper) APPLICATION OF CELL SAVER (Bilateral: Leg Upper)  Patient Location: PACU  Anesthesia Type:General  Level of Consciousness: awake, drowsy, and patient cooperative  Airway & Oxygen Therapy: Patient Spontanous Breathing  Post-op Assessment: Report given to RN, Post -op Vital signs reviewed and stable, and Patient moving all extremities X 4  Post vital signs: Reviewed and stable  Last Vitals:  Vitals Value Taken Time  BP 113/64 04/18/23 1236  Temp    Pulse 78 04/18/23 1242  Resp 21 04/18/23 1242  SpO2 91 % 04/18/23 1242  Vitals shown include unfiled device data.  Last Pain:  Vitals:   04/18/23 0615  TempSrc: Oral  PainSc: 6          Complications: No notable events documented.

## 2023-04-18 NOTE — Anesthesia Preprocedure Evaluation (Signed)
Anesthesia Evaluation  Patient identified by MRN, date of birth, ID band Patient awake    Reviewed: Allergy & Precautions, NPO status , Patient's Chart, lab work & pertinent test results  History of Anesthesia Complications Negative for: history of anesthetic complications  Airway Mallampati: II  TM Distance: >3 FB Neck ROM: full    Dental  (+) Edentulous Upper, Edentulous Lower   Pulmonary COPD,  COPD inhaler, Current Smoker and Patient abstained from smoking.   Pulmonary exam normal        Cardiovascular hypertension, On Medications + CAD and + Peripheral Vascular Disease  Normal cardiovascular exam  Stress echo 04/04/23  NORMAL STRESS ECHOCARDIOGRAM. NORMAL RESTING STUDY WITH NO WALL MOTION ABNORMALITIES AT REST AND PEAK EXERCISE.  Maximum workload of 4.6 METs was achieved during exercise.     Neuro/Psych negative neurological ROS  negative psych ROS   GI/Hepatic Neg liver ROS,GERD  Controlled,,  Endo/Other  negative endocrine ROSdiabetes, Type 2    Renal/GU      Musculoskeletal  (+) Arthritis ,    Abdominal   Peds  Hematology negative hematology ROS (+)   Anesthesia Other Findings Past Medical History: No date: Arthritis No date: Atherosclerosis of abdominal aorta (HCC) No date: Atherosclerosis of native arteries of extremity with  intermittent claudication (HCC) No date: Atherosclerosis of native arteries of extremity with rest  pain (HCC) No date: Chronic bronchitis (HCC) No date: Coronary artery disease No date: Diabetes mellitus type 2 with complications (HCC) No date: GERD (gastroesophageal reflux disease) No date: Hypertension No date: Post-traumatic osteoarthritis of right hip  Past Surgical History: No date: FRACTURE SURGERY; Left     Comment:  wrist No date: HERNIA REPAIR; Bilateral     Comment:  groin 03/15/2023: LOWER EXTREMITY ANGIOGRAPHY; Right     Comment:  Procedure: Lower  Extremity Angiography;  Surgeon: Annice Needy, MD;  Location: [facility] INVASIVE CV LAB;  Service:               Cardiovascular;  Laterality: Right; No date: MUSCLE REPAIR; Right     Comment:  arm and leg 09/04/2017: TOTAL HIP ARTHROPLASTY; Right     Comment:  Procedure: TOTAL HIP ARTHROPLASTY ANTERIOR APPROACH;                Surgeon: Kennedy Bucker, MD;  Location: [facility] ORS;                Service: Orthopedics;  Laterality: Right;  BMI    Body Mass Index: 29.77 kg/m      Reproductive/Obstetrics negative OB ROS                             Anesthesia Physical Anesthesia Plan  ASA: 3  Anesthesia Plan: General ETT   Post-op Pain Management: Toradol IV (intra-op)* and Ofirmev IV (intra-op)*   Induction: Intravenous  PONV Risk Score and Plan: 2 and Ondansetron, Dexamethasone and Treatment may vary due to age or medical condition  Airway Management Planned: Oral ETT  Additional Equipment:   Intra-op Plan:   Post-operative Plan: Extubation in OR  Informed Consent: I have reviewed the patients History and Physical, chart, labs and discussed the procedure including the risks, benefits and alternatives for the proposed anesthesia with the patient or authorized representative who has indicated his/her understanding and acceptance.     Dental Advisory Given  Plan Discussed  with: Anesthesiologist, CRNA and Surgeon  Anesthesia Plan Comments: (Patient consented for risks of anesthesia including but not limited to:  - adverse reactions to medications - damage to eyes, teeth, lips or other oral mucosa - nerve damage due to positioning  - sore throat or hoarseness - Damage to heart, brain, nerves, lungs, other parts of body or loss of life  Patient voiced understanding and assent.)        Anesthesia Quick Evaluation

## 2023-04-18 NOTE — Op Note (Signed)
OPERATIVE NOTE   PROCEDURE: 1.   Left common femoral, profunda femoris, and superficial femoral artery endarterectomies 2.   Right common femoral, profunda femoris, and superficial femoral artery endarterectomies 3.   Right lower extremity angiogram 4.  Angioplasty of the right anterior tibial artery with 3 mm diameter by 22 cm length angioplasty balloon 5.  Angioplasty of the right SFA and proximal popliteal artery with 5 mm diameter Lutonix drug-coated angioplasty balloon 6.  Viabahn stent placement x 3 to the right SFA most proximal popliteal artery with 8 mm diameter by 5 cm length, 7 mm diameter by 25 cm length, and 7 mm diameter by 10 cm length Viabahn stents for residual stenosis after angioplasty 7.   Incisional (disposable) VAC dressing placements to both groins.   PRE-OPERATIVE DIAGNOSIS: 1.Atherosclerotic occlusive disease bilateral lower extremities with rest pain on the right and claudication on the left   POST-OPERATIVE DIAGNOSIS: Same  SURGEON: Festus Barren, MD  CO-surgeon:  Levora Dredge, MD  ANESTHESIA:  general  ESTIMATED BLOOD LOSS: 700 cc  FINDING(S): 1.  significant plaque in bilateral common femoral, profunda femoris, and superficial femoral arteries 2.  Right SFA occlusion and 70 to 80% right anterior tibial artery stenosis  SPECIMEN(S):  Bilateral common femoral, profunda femoris, and superficial femoral artery plaque.  INDICATIONS:    Patient presents with severe peripheral arterial disease with symptoms that have progressed to rest pain on the right and claudication symptoms on the left.  Bilateral femoral endarterectomies as well as right lower extremity infrainguinal intervention are planned to try to improve perfusion.  The risks and benefits as well as alternative therapies including intervention were reviewed in detail all questions were answered the patient agrees to proceed with surgery.  DESCRIPTION: After obtaining full informed written consent,  the patient was brought back to the operating room and placed supine upon the operating table.  The patient received IV antibiotics prior to induction.  After obtaining adequate anesthesia, the patient was prepped and draped in the standard fashion appropriate time out is called.    With myself working on the right and Dr. Gilda Crease working on the left we began by dissecting out the femoral arteries on each side. Vertical incisions were created overlying both femoral arteries. The common femoral artery proximally, and superficial femoral artery, and primary profunda femoris artery branches were encircled with vessel loops and prepared for control. Both femoral arteries were found to have significant plaque from the common femoral artery into the profunda and superficial femoral arteries.   7000 units of heparin was given and allowed circulate for 5 minutes.  An additional 2000 units of heparin were given later in the procedure.  Attention is then turned to the left femoral artery.  An arteriotomy is made with 11 blade and extended with Potts scissors in the common femoral artery and carried down onto the first 3-4 cm of the left superficial femoral artery. An endarterectomy was then performed. The [facility] was used to create a plane. The proximal endpoint was cut flush with tenotomy scissors. This was in the proximal common femoral artery. An eversion endarterectomy was then performed for the first 2-3 cm of the profunda femoris artery. Good backbleeding was then seen. The distal endpoint of the superficial femoral endarterectomy was created with gentle traction and the Potts scissors and the distal endpoint was tacked down with several 7-0 prolene sutures.  The bovine pericardial patcth is then selected and prepared for a patch angioplasty.  It is cut and  beveled and started at the proximal endpoint with a 6-0 Prolene suture.  Approximately one half of the suture line is run medially and laterally and  the distal end point was cut and bevelled to match the arteriotomy.  A second 6-0 Prolene was started at the distal end point and run to the mid portion to complete the arteriotomy.  The vessel was flushed prior to release of control and completion of the anastomosis.  At this point, flow was established first to the profunda femoris artery and then to the superficial femoral artery. Easily palpable pulses are noted well beyond the anastomosis and both arteries.  The right femoral artery is then addressed. Arteriotomy is made in the common femoral artery and extended down into the first 3 to 4 cm of the superficial femoral artery. Similarly, an endarterectomy was performed with the [facility]. The proximal endpoint was cut flush with tenotomy scissors in the proximal common femoral artery.  The profunda femoris artery proximally was addressed and treated with an eversion endarterectomy and this was performed with a hemostat and gentle traction. The arteriotomy was carried down onto the superficial femoral artery and the endarterectomy was continued to this point. The distal endpoint was created with gentle traction and Potts scissors and tacked down with a 7-0 Prolene suture. The bovine pericardial patch was then brought onto the field.  It is cut and beveled and started at the proximal endpoint with a 6-0 Prolene suture.  Approximately one half of the suture line is run medially and laterally and the distal end point was cut and bevelled to match the arteriotomy.  A second 6-0 Prolene was started at the distal end point and run to the mid portion to complete the arteriotomy, leaving a gap for the endovascular portion of the procedure.   A 7 French sheath was then placed through the gap and parked in the proximal superficial femoral artery.  Imaging was performed showing a long segment SFA occlusion down to the distal SFA with reconstitution at this location.  There was calcific disease in the proximal  superficial femoral artery and then a high takeoff of the posterior tibial artery which was the largest runoff distally.  The anterior tibial artery had about a 70 to 80% stenosis that was not initially seen until splaying this out better on an RAO projection.  The peroneal artery was also patent.  I then used an advantage wire and Kumpe catheter to cross the occlusion without difficulty and confirm intraluminal flow in the popliteal artery.  We then exchanged for a V18 wire and we used this to cross the stenosis in the anterior tibial artery.  A 3 mm diameter by 22 cm length angioplasty balloon was then used to treat the anterior tibial artery with an inflation up to 10 atm for 1 minute.  I then used this 3 mm balloon to predilate the calcific SFA lesion.  A 5 mm diameter by 30 cm length angioplasty balloon was then inflated and SFA but there was long segment high-grade residual stenosis and we elected to stent this area.  A 7 mm diameter by 25 cm length Viabahn stent was deployed in the right SFA and then an 8 mm diameter by 5 cm length Viabahn stent was taken up into the most proximal SFA just into the patch angioplasty closure of the endarterectomy.  This was postdilated with a 6 mm Lutonix drug-coated balloon throughout and a 7 mm balloon proximally.  There was still some residual  disease just distal to the stent at [facility] canal and so an additional 7 mm diameter by 10 cm length Viabahn stent was deployed in the proximal popliteal artery and distal SFA and postdilated with a 6 mm balloon with excellent angiographic completion result and less than 10% residual stenosis throughout the SFA and popliteal artery.  The sheath was then removed and we turned our attention to the closure of the arteriotomy.  Flushing maneuvers were performed and flow was reestablished to the femoral vessels. Excellent pulses noted in the right superficial femoral and profunda femoris artery below the femoral anastomosis.  Fibrillar  and Vistacel topical hemostatic agents were placed in the femoral incisions and hemostasis was complete.  Gentamicin and vancomycin impregnated beads were placed in both wounds.  The femoral incisions were then closed in a layered fashion with 2 layers of 2-0 Vicryl, 2 layers of 3-0 Vicryl, and 3-0 nylon sutures for the skin closure.  Incisional (disposable) vacs were then placed over both wounds.  The suction sponge was placed down and the suction tubing connected to suction with a good occlusive seal obtained with the adhesive dressings.  The patient was then awakened from anesthesia and taken to the recovery room in stable condition having tolerated the procedure well.  COMPLICATIONS: None  CONDITION: Stable     Festus Barren 04/18/2023 12:24 PM  This note was created with Dragon Medical transcription system. Any errors in dictation are purely unintentional.

## 2023-04-18 NOTE — Op Note (Signed)
OPERATIVE NOTE   PROCEDURE: Bilateral common femoral, superficial femoral and profunda femoris endarterectomy with bovine patch angioplasty 2.   Right lower extremity angiogram 3.  Angioplasty of the right anterior tibial artery with 3 mm diameter      by 22 cm length angioplasty balloon 4.   Angioplasty of the right SFA and proximal popliteal artery with 5 mm diameter Lutonix drug-coated angioplasty balloon 5.   Viabahn stent placement x 3 to the right SFA most proximal popliteal artery with 8 mm diameter by 5 cm length, 7 mm diameter by 25 cm length, and 7 mm diameter by 10 cm length Viabahn stents for residual stenosis after angioplasty 6.  Incisional (disposable) VAC dressing placements to both groins.   PRE-OPERATIVE DIAGNOSIS: Atherosclerotic occlusive disease bilateral lower extremities with lifestyle limiting claudication and rest pain symptoms  POST-OPERATIVE DIAGNOSIS: Same  CO-SURGEON: Renford Dills, MD and Annice Needy, M.D.  ASSISTANT(S): None  ANESTHESIA: general  ESTIMATED BLOOD LOSS: 700 cc  FINDING(S): Profound calcific plaque noted bilaterally extending past the initial bifurcation of the profunda femoris arteries as well as down the extensive length of the SFA  SPECIMEN(S):  Calcific plaque from the common femoral, superficial femoral and the profunda femoris arteries bilaterally  INDICATIONS:   Phillip Mendez 72 y.o. y.o.male who presents with complaints of lifestyle limiting claudication and pain continuously in the feet bilaterally. The patient has documented severe atherosclerotic occlusive disease and has undergone multiple minimally invasive treatments in the past. However, at this point his primary area of stricture stenosis resides in the common femoral and origins of the superficial femoral and profunda femoris extending into these arteries and therefore this is not amenable to intervention and he is now undergoing open endarterectomy. The risks  and benefits of been reviewed with the patient, all questions have answered; alternative therapies have been reviewed as well and the patient has agreed to proceed with surgical open repair.  DESCRIPTION: After obtaining full informed written consent, the patient was brought back to the operating room and placed supine upon the operating table.  The patient received IV antibiotics prior to induction.  After obtaining adequate anesthesia, the patient was prepped and draped in the standard fashion for: bilateral femoral exposure.  Co-surgeons are required because this is a bilateral procedure with work being performed simultaneously from both the right femoral and left femoral approach.  This also expedites the procedure making a shorter operative time reducing complications and improving patient safety.  Attention was turned to the bilateral groin with Dr. Wyn Quaker working on the right and myself working on the left of the patient.  Vertical  incisions were made over the common femoral artery and dissected down to the common femoral artery with electrocautery.  I dissected out the common femoral artery from the distal external iliac artery (identified by the superficial circumflex vessels) down to the femoral bifurcation.  On initial inspection, the common femoral artery was: Densely calcified and there was no palpable pulse noted bilaterally.    Subsequently the dissection was continued to include all circumflex branches and the profunda femoral artery and superficial femoral artery. The superficial femoral artery was dissected circumferentially for a distance of approximately 3-4 cm and the profunda femoris was dissected circumferentially out to the fourth order branches individual vessel loops were placed around each branch. Both of the groins were treated simultaneously as described above. Control of all branches was obtained with vessel loops.  A softer area in the distal external  iliac artery amendable to  clamping was identified.    The patient was given 7000 units of Heparin intravenously (an additional 2000 units was given later in the case), which was a therapeutic bolus.   After waiting 3 minutes, the left distal external iliac artery was clamped and placed all circumflex branches, and the profunda and superficial femoral arteries under tension.  Arteriotomy was made in the left common femoral artery with a 11-blade and extended it with a Potts scissor proximally and distally extending the distal end down the SFA for approximately 3 cm.   Endarterectomy was then performed under direct visualization using a freer elevator and a right angle from the mid left common femoral extending up both proximally and distally. Proximally the endarterectomy was brought up to the level of the clamp where a clean edge was obtained. Distally the endarterectomy was carried down to a soft spot in the left SFA where a feathered edge would was obtained.  7-0 Prolene interrupted tacking sutures were placed to secure the leading edge of the plaque in the left SFA.  The left profunda femoris was treated with an eversion technique extending endarterectomy approximately 2 cm distally again obtaining a featheredge on both sides right and left.   At this point, we fashioned a core matrix patch for the geometry of the arteriotomy.  The patch was sewn to the artery with 2 running stitches of 6-0 Prolene, running from each end.  Prior to completing the patch angioplasty, the profunda femoral artery was flushed as was the superficial femoral artery. The system was then forward flushed. The endarterectomy site was then irrigated copiously with heparinized saline. The patch angioplasty was completed in the usual fashion.  Flow was then reestablished first to the profunda femoris and then the superficial femoral artery. Any gaps or bleeding sites in the suture line were easily controlled with a 6-0 Prolene suture.  The left groin was then  packed with a saline soaked gauze and attention was turned to the right femoral artery.  The right distal external iliac artery was clamped and placed all circumflex branches, and the profunda and superficial femoral arteries under tension.  Arteriotomy was made in the right common femoral artery with a 11-blade and extended it with a Potts scissor proximally and distally extending the distal end down the SFA for approximately 3 cm.   Endarterectomy was then performed under direct visualization using a freer elevator and a right angle from the mid right common femoral extending up both proximally and distally. Proximally the endarterectomy was brought up to the level of the clamp where a clean edge was obtained. Distally the endarterectomy was carried down to a soft spot in the right SFA where a feathered edge would was obtained.  7-0 Prolene interrupted tacking sutures were placed to secure the leading edge of the plaque in the right SFA.  The right profunda femoris was treated with an eversion technique extending endarterectomy approximately 2 cm distally again obtaining a featheredge on both sides right and left.   At this point, we fashioned a core matrix patch for the geometry of the arteriotomy.  The patch was sewn to the artery with 2 running stitches of 6-0 Prolene, running from each end.  Prior to completing the patch angioplasty, the profunda femoral artery was flushed as was the superficial femoral artery. The system was then forward flushed. The endarterectomy site was then irrigated copiously with heparinized saline.  A small gap was left in the lateral wall  suture line.  And we moved forward with the interventional portion.  With Dr. Wyn Quaker working as the primary surgeon myself as a first assistant a 7 French sheath was placed through the gap in the patch.  Imaging was performed showing a long segment SFA occlusion down to the distal SFA with reconstitution at this location.  There was calcific  disease in the proximal superficial femoral artery and then a high takeoff of the posterior tibial artery which was the largest runoff distally.  The anterior tibial artery had about a 70 to 80% stenosis that was not initially seen until splaying this out better on an RAO projection.  The peroneal artery was also patent.  I then used an advantage wire and Kumpe catheter to cross the occlusion without difficulty and confirm intraluminal flow in the popliteal artery.  We then exchanged for a V18 wire and we used this to cross the stenosis in the anterior tibial artery.  A 3 mm diameter by 22 cm length angioplasty balloon was then used to treat the anterior tibial artery with an inflation up to 10 atm for 1 minute.  I then used this 3 mm balloon to predilate the calcific SFA lesion.  A 5 mm diameter by 30 cm length angioplasty balloon was then inflated and SFA but there was long segment high-grade residual stenosis and we elected to stent this area.  A 7 mm diameter by 25 cm length Viabahn stent was deployed in the right SFA and then an 8 mm diameter by 5 cm length Viabahn stent was taken up into the most proximal SFA just into the patch angioplasty closure of the endarterectomy.  This was postdilated with a 6 mm Lutonix drug-coated balloon throughout and a 7 mm balloon proximally.  There was still some residual disease just distal to the stent at [place] canal and so an additional 7 mm diameter by 10 cm length Viabahn stent was deployed in the proximal popliteal artery and distal SFA and postdilated with a 6 mm balloon with excellent angiographic completion result and less than 10% residual stenosis throughout the SFA and popliteal artery.  The sheath was then removed and we turned our attention to the closure of the arteriotomy.  Flushing maneuvers were performed and flow was reestablished to the femoral vessels.    Both right and left groins were then irrigated copiously with sterile saline and subsequently  Vistaseal and fibrillar were placed in the wound.  Additionally, vancomycin and gentamicin beads were reconstituted on the back table and then added to both groin incisions.  The incision was repaired with a double layer of 2-0 Vicryl, a double layer of 3-0 Vicryl, and the skin was closed with a combination of interrupted 3-0 nylon vertical mattress sutures with staples.  Prevena disposable vacs were then applied to both groins.  COMPLICATIONS: None  CONDITION: Almon Register, M.D. [facility] Office: [phone]  04/18/2023, 1:37 PM

## 2023-04-18 NOTE — Anesthesia Procedure Notes (Cosign Needed)
Procedure Name: Intubation Date/Time: 04/18/2023 7:46 AM  Performed by: Lindell Noe, RNPre-anesthesia Checklist: Patient identified, Emergency Drugs available, Suction available and Patient being monitored Patient Re-evaluated:Patient Re-evaluated prior to induction Oxygen Delivery Method: Circle system utilized Preoxygenation: Pre-oxygenation with 100% oxygen Induction Type: IV induction Ventilation: Mask ventilation without difficulty Laryngoscope Size: Mac and 4 Grade View: Grade II Tube type: Oral Tube size: 7.5 mm Number of attempts: 1 Airway Equipment and Method: Stylet and Oral airway Placement Confirmation: ETT inserted through vocal cords under direct vision, positive ETCO2 and breath sounds checked- equal and bilateral Secured at: 21 cm Tube secured with: Tape Dental Injury: Teeth and Oropharynx as per pre-operative assessment

## 2023-04-18 NOTE — Anesthesia Postprocedure Evaluation (Signed)
Anesthesia Post Note  Patient: Phillip Mendez  Procedure(s) Performed: ENDARTERECTOMY FEMORAL (RIGHT SFA STENT) (POSSIBLE RIGHT FEM-POP BYPASS) (Bilateral: Leg Upper) APPLICATION OF CELL SAVER (Bilateral: Leg Upper)  Patient location during evaluation: PACU Anesthesia Type: General Level of consciousness: awake and alert Pain management: pain level controlled Vital Signs Assessment: post-procedure vital signs reviewed and stable Respiratory status: spontaneous breathing, nonlabored ventilation, respiratory function stable and patient connected to nasal cannula oxygen Cardiovascular status: blood pressure returned to baseline and stable Postop Assessment: no apparent nausea or vomiting Anesthetic complications: no   No notable events documented.   Last Vitals:  Vitals:   04/18/23 1400 04/18/23 1415  BP: (!) 79/54 (!) 80/58  Pulse: 66 67  Resp: 17 13  Temp:    SpO2: 96% 96%    Last Pain:  Vitals:   04/18/23 1415  TempSrc:   PainSc: 6                  Louie Boston

## 2023-04-18 NOTE — Interval H&P Note (Signed)
History and Physical Interval Note:  04/18/2023 7:24 AM  Phillip Mendez  has presented today for surgery, with the diagnosis of ASO WITH REST PAIN.  The various methods of treatment have been discussed with the patient and family. After consideration of risks, benefits and other options for treatment, the patient has consented to  Procedure(s): ENDARTERECTOMY FEMORAL (RIGHT SFA STENT) (POSSIBLE RIGHT FEM-POP BYPASS) (Bilateral) APPLICATION OF CELL SAVER (Bilateral) as a surgical intervention.  The patient's history has been reviewed, patient examined, no change in status, stable for surgery.  I have reviewed the patient's chart and labs.  Questions were answered to the patient's satisfaction.     Levora Dredge

## 2023-04-19 ENCOUNTER — Encounter: Payer: Self-pay | Admitting: Vascular Surgery

## 2023-04-19 DIAGNOSIS — Z9889 Other specified postprocedural states: Secondary | ICD-10-CM

## 2023-04-19 DIAGNOSIS — Z95828 Presence of other vascular implants and grafts: Secondary | ICD-10-CM

## 2023-04-19 LAB — CBC
HCT: 37.3 % — ABNORMAL LOW (ref 39.0–52.0)
Hemoglobin: 12.3 g/dL — ABNORMAL LOW (ref 13.0–17.0)
MCH: 31.5 pg (ref 26.0–34.0)
MCHC: 33 g/dL (ref 30.0–36.0)
MCV: 95.4 fL (ref 80.0–100.0)
Platelets: 188 10*3/uL (ref 150–400)
RBC: 3.91 MIL/uL — ABNORMAL LOW (ref 4.22–5.81)
RDW: 14.2 % (ref 11.5–15.5)
WBC: 13 10*3/uL — ABNORMAL HIGH (ref 4.0–10.5)
nRBC: 0 % (ref 0.0–0.2)

## 2023-04-19 LAB — BASIC METABOLIC PANEL
Anion gap: 6 (ref 5–15)
BUN: 24 mg/dL — ABNORMAL HIGH (ref 8–23)
CO2: 24 mmol/L (ref 22–32)
Calcium: 8.1 mg/dL — ABNORMAL LOW (ref 8.9–10.3)
Chloride: 106 mmol/L (ref 98–111)
Creatinine, Ser: 1.27 mg/dL — ABNORMAL HIGH (ref 0.61–1.24)
GFR, Estimated: >60 mL/min (ref >60)
Glucose, Bld: 189 mg/dL — ABNORMAL HIGH (ref 70–99)
Potassium: 4.4 mmol/L (ref 3.5–5.1)
Sodium: 136 mmol/L (ref 135–145)

## 2023-04-19 LAB — SURGICAL PATHOLOGY

## 2023-04-19 MED ORDER — CALCIUM CARBONATE ANTACID 500 MG PO CHEW
2.0000 | CHEWABLE_TABLET | Freq: Two times a day (BID) | ORAL | Status: DC | PRN
  Administered 2023-04-19 – 2023-04-20 (×2): 400 mg via ORAL
  Filled 2023-04-19 (×3): qty 2

## 2023-04-19 MED ORDER — FAMOTIDINE 20 MG PO TABS
20.0000 mg | ORAL_TABLET | Freq: Two times a day (BID) | ORAL | Status: DC
  Administered 2023-04-19 – 2023-04-23 (×8): 20 mg via ORAL
  Filled 2023-04-19 (×8): qty 1

## 2023-04-19 MED ORDER — CALCIUM CARBONATE ANTACID 500 MG PO CHEW: 200.0000 mg | CHEWABLE_TABLET | Freq: Two times a day (BID) | ORAL | Status: DC

## 2023-04-19 NOTE — Plan of Care (Signed)
  Problem: Education: Goal: Knowledge of General Education information will improve Description: Including pain rating scale, medication(s)/side effects and non-pharmacologic comfort measures Outcome: Progressing   Problem: Health Behavior/Discharge Planning: Goal: Ability to manage health-related needs will improve Outcome: Progressing   Problem: Clinical Measurements: Goal: Ability to maintain clinical measurements within normal limits will improve Outcome: Progressing Goal: Will remain free from infection Outcome: Progressing Goal: Diagnostic test results will improve Outcome: Progressing Goal: Respiratory complications will improve Outcome: Progressing Goal: Cardiovascular complication will be avoided Outcome: Progressing   Problem: Activity: Goal: Risk for activity intolerance will decrease Outcome: Progressing   Problem: Nutrition: Goal: Adequate nutrition will be maintained Outcome: Progressing   Problem: Coping: Goal: Level of anxiety will decrease Outcome: Progressing   Problem: Elimination: Goal: Will not experience complications related to bowel motility Outcome: Progressing Goal: Will not experience complications related to urinary retention Outcome: Progressing   Problem: Coping: Goal: Level of anxiety will decrease Outcome: Progressing   Problem: Pain Management: Goal: General experience of comfort will improve Outcome: Progressing   Problem: Safety: Goal: Ability to remain free from injury will improve Outcome: Progressing

## 2023-04-19 NOTE — Evaluation (Addendum)
Physical Therapy Evaluation Patient Details Name: Phillip Mendez MRN: [id number] DOB: 19-Feb-1951 Today's Date: 04/19/2023  History of Present Illness  Patient is a 72 year old male s/p bilateral femoral endarterectomy  Clinical Impression  Patient is agreeable to PT evaluation. He reports he lives with his spouse who is a retired Engineer, civil (consulting), and is independent with mobility at baseline (limited distance ambulation).  Patient does report groin pain today with movement. Min A required for bed mobility and for standing. Two standing bouts performed from bed. Activity tolerance is limited by dizziness initially, pain, and generalized weakness. MAP in the low 70's with activity with Sp02 92-94% on room air. The patient is hopeful for return home soon. Recommend PT follow to maximize independence and decrease caregiver burden.       If plan is discharge home, recommend the following: A little help with walking and/or transfers;A little help with bathing/dressing/bathroom;Assistance with cooking/housework;Supervision due to cognitive status;Help with stairs or ramp for entrance   Can travel by private vehicle        Equipment Recommendations Rolling walker (2 wheels)  Recommendations for Other Services       Functional Status Assessment Patient has had a recent decline in their functional status and demonstrates the ability to make significant improvements in function in a reasonable and predictable amount of time.     Precautions / Restrictions Precautions Precautions: Fall Precaution Comments: wound vac x 2 Restrictions Weight Bearing Restrictions: No      Mobility  Bed Mobility Overal bed mobility: Needs Assistance Bed Mobility: Supine to Sit, Sit to Supine     Supine to sit: Min assist Sit to supine: Min assist   General bed mobility comments: assistance for trunk support to sit upright. assistance for LE support to return to bed. cues for technique    Transfers Overall  transfer level: Needs assistance Equipment used: Rolling walker (2 wheels) Transfers: Sit to/from Stand Sit to Stand: Min assist, Contact guard assist, From elevated surface           General transfer comment: 2 standing bouts performed. patient reports dizziness with initial stand. MAP in the low 70's with seated level activity, Sp02 94% on room air. cues for technique and to monitor for signs of increased dizziness with mobility.    Ambulation/Gait             Pre-gait activities: standing tolerance of ~ 3-4 minutes. patient was able to take 4 smalle side steps along edge of bed with CGA. standing activity tolerance limited by fatigue. patient was relying on bed for posterior leg support with standing    Stairs            Wheelchair Mobility     Tilt Bed    Modified Rankin (Stroke Patients Only)       Balance Overall balance assessment: Needs assistance Sitting-balance support: Feet supported Sitting balance-Leahy Scale: Good     Standing balance support: Single extremity supported Standing balance-Leahy Scale: Fair Standing balance comment: intermittently relying on bed for posterior leg support                             Pertinent Vitals/Pain Pain Assessment Pain Assessment: Faces Faces Pain Scale: Hurts even more Pain Location: L groin > R groin Pain Descriptors / Indicators: Burning Pain Intervention(s): Limited activity within patient's tolerance, Monitored during session, Repositioned, Patient requesting pain meds-RN notified    Home Living Family/patient expects to  be discharged to:: Private residence Living Arrangements: Spouse/significant other Available Help at Discharge: Family Type of Home: House Home Access: Stairs to enter Entrance Stairs-Rails: Right;Left;Can reach both Secretary/administrator of Steps: 2   Home Layout: One level Home Equipment: Cane - single point      Prior Function Prior Level of Function :  Independent/Modified Independent             Mobility Comments: limited distances secondary to pain and weakness ADLs Comments: independent     Extremity/Trunk Assessment   Upper Extremity Assessment Upper Extremity Assessment: Overall WFL for tasks assessed    Lower Extremity Assessment Lower Extremity Assessment: Generalized weakness (pain in the groin with AROM of hips)       Communication   Communication Communication: No apparent difficulties  Cognition Arousal: Alert Behavior During Therapy: WFL for tasks assessed/performed Overall Cognitive Status: Within Functional Limits for tasks assessed                                          General Comments General comments (skin integrity, edema, etc.): noted IV was not secured in the L arm (no medications currently running) and was haning on by a piece of tape. alerted nurse at end of session    Exercises     Assessment/Plan    PT Assessment Patient needs continued PT services  PT Problem List Decreased strength;Decreased range of motion;Decreased activity tolerance;Decreased balance;Decreased mobility;Pain;Decreased safety awareness       PT Treatment Interventions DME instruction;Gait training;Stair training;Functional mobility training;Therapeutic activities;Therapeutic exercise;Balance training;Neuromuscular re-education;Cognitive remediation;Patient/family education    PT Goals (Current goals can be found in the Care Plan section)  Acute Rehab PT Goals Patient Stated Goal: to return home PT Goal Formulation: With patient Time For Goal Achievement: 05/03/23 Potential to Achieve Goals: Good    Frequency Min 1X/week     Co-evaluation               AM-PAC PT "6 Clicks" Mobility  Outcome Measure Help needed turning from your back to your side while in a flat bed without using bedrails?: A Little Help needed moving from lying on your back to sitting on the side of a flat bed without  using bedrails?: A Little Help needed moving to and from a bed to a chair (including a wheelchair)?: A Little Help needed standing up from a chair using your arms (e.g., wheelchair or bedside chair)?: A Little Help needed to walk in hospital room?: A Little Help needed climbing 3-5 steps with a railing? : A Lot 6 Click Score: 17    End of Session   Activity Tolerance: Patient limited by fatigue Patient left: in bed;with call bell/phone within reach Nurse Communication: Mobility status PT Visit Diagnosis: Muscle weakness (generalized) (M62.81);Unsteadiness on feet (R26.81)    Time: 4098-1191 PT Time Calculation (min) (ACUTE ONLY): 35 min   Charges:   PT Evaluation $PT Eval Moderate Complexity: 1 Mod PT Treatments $Therapeutic Activity: 8-22 mins PT General Charges $$ ACUTE PT VISIT: 1 Visit         Donna Bernard, PT, MPT   Ina Homes 04/19/2023, 10:02 AM

## 2023-04-19 NOTE — Evaluation (Signed)
Occupational Therapy Evaluation Patient Details Name: Phillip Mendez MRN: [id number] DOB: Apr 08, 1951 Today's Date: 04/19/2023   History of Present Illness Patient is a 71 year old male s/p bilateral femoral endarterectomy   Clinical Impression   Pt was seen for OT evaluation this date. Pt alert and oriented x4, greeted resting in bed. Verbal encouragement required to participate in session. Prior to hospital admission, pt was indep in all ADL/IADL, recently limited amb distances by increase in pain levels PTA. Pt lives with his wife in one level home with 2 steps to enter. Pt reports his wife is a retired Charity fundraiser, who is willing and able to assist at d/c as needed. Pt presents to acute OT demonstrating impaired ADL performance and functional mobility 2/2 (See OT problem list for additional functional deficits). Pt currently requires CGA for bed mobility, with use of elevated HOB and bed rails. Pt requires CGA for STS from EOB with use of RW. Pt completed 4 trials of forward and backward steps with RW+CGA. Pt tolerated standing for ~15 minutes with no LOB noted. Multiple standing rest breaks througout. Vss, bil wound vac in place pre/post session. Pt left supine in bed, with call bell in reach. Pt would benefit from skilled OT services to address noted impairments and functional limitations (see below for any additional details) in order to maximize safety and independence while minimizing falls risk and caregiver burden. OT will follow acutely.       If plan is discharge home, recommend the following: A little help with walking and/or transfers;A lot of help with bathing/dressing/bathroom;Help with stairs or ramp for entrance;Assistance with cooking/housework;Assist for transportation    Functional Status Assessment  Patient has had a recent decline in their functional status and demonstrates the ability to make significant improvements in function in a reasonable and predictable amount of time.   Equipment Recommendations  Other (comment);Tub/shower seat (Pt reports he will priviate pay if needed)    Recommendations for Other Services       Precautions / Restrictions Precautions Precautions: Fall Precaution Comments: wound vac x 2 Restrictions Weight Bearing Restrictions: No      Mobility Bed Mobility Overal bed mobility: Needs Assistance Bed Mobility: Supine to Sit, Sit to Supine     Supine to sit: Contact guard, Used rails, HOB elevated Sit to supine: Min assist, HOB elevated, Used rails   General bed mobility comments: Limited mobility on this date due to pain levels and activity tolerance.    Transfers Overall transfer level: Needs assistance Equipment used: Rolling walker (2 wheels) Transfers: Sit to/from Stand Sit to Stand: Contact guard assist, From elevated surface           General transfer comment: STS from EOB, completed 4 trials of forward and backward steps with RW+CGA. Pt tolerated standing for ~15 minutes with no LOB needed. Multiple standing breaks througout.      Balance Overall balance assessment: Needs assistance Sitting-balance support: Feet supported Sitting balance-Leahy Scale: Good     Standing balance support: Single extremity supported Standing balance-Leahy Scale: Fair                             ADL either performed or assessed with clinical judgement   ADL Overall ADL's : Needs assistance/impaired Eating/Feeding: Independent Eating/Feeding Details (indicate cue type and reason): drinking straw                 Lower Body Dressing: Maximal assistance;Bed level  Lower Body Dressing Details (indicate cue type and reason): anticipate             Functional mobility during ADLs: Contact guard assist;Rolling walker (2 wheels)       Vision Baseline Vision/History: 1 Wears glasses       Perception         Praxis         Pertinent Vitals/Pain Pain Assessment Pain Assessment: Faces Faces Pain  Scale: Hurts little more Pain Location: L groin > R groin Pain Descriptors / Indicators: Burning Pain Intervention(s): Limited activity within patient's tolerance, Monitored during session     Extremity/Trunk Assessment Upper Extremity Assessment Upper Extremity Assessment: Generalized weakness;Overall [facility] for tasks assessed   Lower Extremity Assessment Lower Extremity Assessment: Defer to PT evaluation;Generalized weakness   Cervical / Trunk Assessment Cervical / Trunk Assessment: Normal   Communication Communication Communication: No apparent difficulties Cueing Techniques: Verbal cues   Cognition Arousal: Alert Behavior During Therapy: WFL for tasks assessed/performed Overall Cognitive Status: Within Functional Limits for tasks assessed                                       General Comments  Bil wound vacs in tact pre/post session    Exercises Other Exercises Other Exercises: Edu: role of OT, safe ADL completion, DME use at d/c for fall prevention/pain management   Shoulder Instructions      Home Living Family/patient expects to be discharged to:: Private residence Living Arrangements: Spouse/significant other Available Help at Discharge: Family Type of Home: House Home Access: Stairs to enter Secretary/administrator of Steps: 2 Entrance Stairs-Rails: Right;Left;Can reach both Home Layout: One level     Bathroom Shower/Tub: Producer, television/film/video: Handicapped height Bathroom Accessibility: Yes   Home Equipment: Cane - single point          Prior Functioning/Environment Prior Level of Function : Independent/Modified Independent             Mobility Comments: limited distances secondary to pain and weakness ADLs Comments: independent        OT Problem List: Decreased strength;Decreased knowledge of use of DME or AE;Decreased activity tolerance;Decreased safety awareness;Impaired balance (sitting and/or standing)      OT  Treatment/Interventions: Self-care/ADL training;Therapeutic exercise;Patient/family education;Balance training;Energy conservation;Therapeutic activities;DME and/or AE instruction    OT Goals(Current goals can be found in the care plan section) Acute Rehab OT Goals Patient Stated Goal: to return home OT Goal Formulation: With patient Time For Goal Achievement: 05/03/23 Potential to Achieve Goals: Good ADL Goals Pt Will Perform Grooming: with modified independence;standing Pt Will Perform Lower Body Dressing: with modified independence;sit to/from stand Pt Will Transfer to Toilet: with modified independence;ambulating;regular height toilet Pt Will Perform Toileting - Clothing Manipulation and hygiene: with modified independence;sitting/lateral leans  OT Frequency: Min 1X/week    Co-evaluation              AM-PAC OT "6 Clicks" Daily Activity     Outcome Measure Help from another person eating meals?: None Help from another person taking care of personal grooming?: A Little Help from another person toileting, which includes using toliet, bedpan, or urinal?: A Little Help from another person bathing (including washing, rinsing, drying)?: A Lot Help from another person to put on and taking off regular upper body clothing?: A Little Help from another person to put on and taking off regular lower  body clothing?: A Lot 6 Click Score: 17   End of Session Equipment Utilized During Treatment: Gait belt;Rolling walker (2 wheels) Nurse Communication: Mobility status  Activity Tolerance: Patient tolerated treatment well Patient left: in bed;with call bell/phone within reach;with bed alarm set  OT Visit Diagnosis: Other abnormalities of gait and mobility (R26.89);Muscle weakness (generalized) (M62.81)                Time: 1102-1130 OT Time Calculation (min): 28 min Charges:  OT General Charges $OT Visit: 1 Visit OT Evaluation $OT Eval Moderate Complexity: 1 Mod  [employer], OTS

## 2023-04-19 NOTE — Progress Notes (Signed)
  Progress Note    04/19/2023 7:54 AM 1 Day Post-Op  Subjective:  Guillermo Brunkhorst is a 72 yo male who is POD #1 from Bilateral femoral, profunda femoris and SFA endarterectomies with angioplasty and stent placement to the right.   On exam this morning patient is resting comfortably in bed.  Bilateral Prevena wound vacs in each groin are intact and working well.  Patient does endorse some pain to both areas but states it is tolerable.  Patient was worried about the pulses in his feet so I used a Doppler to Doppler them.  He was able to listen and understand that he has very strong Doppler pulses bilaterally and his dorsalis pedis and his posttibial arteries.  No other complaints overnight.  Vitals are stable.   Vitals:   04/19/23 0600 04/19/23 0700  BP: 102/65 97/63  Pulse: 80 77  Resp: 17 15  Temp:    SpO2: 98% 95%   Physical Exam: Cardiac:  RRR, normal S1 and S2.  No murmurs Lungs: Clear on auscultation throughout but diminished in the bases.  No rales rhonchi or wheezing noted normal respiratory effort. Incisions: Bilateral groin incisions with Prevena wound vacs in place.  Working well.  No complications to the wound. Extremities: Bilateral lower extremities warm to touch this morning.  Toes have good capillary refill.  Doppler pulses very strong bilaterally both DP/PT. Abdomen: Positive bowel sounds throughout, soft, nontender and nondistended. Neurologic: Alert and oriented x 4, answers all questions and follows commands appropriately.  CBC    Component Value Date/Time   WBC 13.0 (H) 04/19/2023 0031   RBC 3.91 (L) 04/19/2023 0031   HGB 12.3 (L) 04/19/2023 0031   HCT 37.3 (L) 04/19/2023 0031   PLT 188 04/19/2023 0031   MCV 95.4 04/19/2023 0031   MCH 31.5 04/19/2023 0031   MCHC 33.0 04/19/2023 0031   RDW 14.2 04/19/2023 0031   LYMPHSABS 2.0 04/16/2023 1205   MONOABS 0.6 04/16/2023 1205   EOSABS 0.2 04/16/2023 1205   BASOSABS 0.0 04/16/2023 1205    BMET    Component  Value Date/Time   NA 136 04/19/2023 0031   K 4.4 04/19/2023 0031   CL 106 04/19/2023 0031   CO2 24 04/19/2023 0031   GLUCOSE 189 (H) 04/19/2023 0031   BUN 24 (H) 04/19/2023 0031   CREATININE 1.27 (H) 04/19/2023 0031   CALCIUM 8.1 (L) 04/19/2023 0031   GFRNONAA >60 04/19/2023 0031   GFRAA >60 09/06/2017 0458    INR    Component Value Date/Time   INR 0.88 08/29/2017 0826     Intake/Output Summary (Last 24 hours) at 04/19/2023 0754 Last data filed at 04/19/2023 1610 Gross per 24 hour  Intake 4745.94 ml  Output 2250 ml  Net 2495.94 ml     Assessment/Plan:  72 y.o. male is s/p Bilateral femoral, profunda femoris and SFA endarterectomies with angioplasty and stent placement to the right.  1 Day Post-Op   PLAN: Advance Diet as tolerated Discontinue Foley Catheter. Pain medication PRN. Incentive spirometry Q 1hr while awake.  PT/OT Eval Transfer to the floor.  DVT prophylaxis:  ASA 81 mg Daily and Plavix 75 mg Daily   Rafe Mackowski R Luella [facility] 04/19/2023 7:54 AM

## 2023-04-20 NOTE — Care Management Important Message (Signed)
Important Message  Patient Details  Name: Phillip Mendez MRN: [id number] Date of Birth: 10-19-1950   Important Message Given:  N/A - LOS <3 / Initial given by admissions     Olegario Messier A Keshayla Schrum 04/20/2023, 2:21 PM

## 2023-04-20 NOTE — Progress Notes (Signed)
Physical Therapy Treatment Patient Details Name: Phillip Mendez MRN: [id number] DOB: 08-16-1950 Today's Date: 04/20/2023   History of Present Illness Patient is a 72 year old male s/p bilateral femoral endarterectomy    PT Comments  Pt is received in bed with nursing staff and spouse at bedside, he is agreeable to PT session. Pt performs bed mobility minA x2-modA x2, transfers supA, and amb CGA for safety. Pt able to amb approx 19ft using RW, however, reports increased B groin pain/discomfort and demonstrates gait impairments (see below for details). Educated Pt on breathing technique to reduce Valsalva in efforts to prevent dizziness which he reported at beginning of session following holding breath during bed mobility-Pt verbalized understanding and demonstrates good technique following education. At this time, Pt demonstrates steady progress towards PT goals but would benefit from cont skilled PT to address above deficits and promote optimal return to PLOF.   If plan is discharge home, recommend the following: A little help with walking and/or transfers;A little help with bathing/dressing/bathroom;Assistance with cooking/housework;Supervision due to cognitive status;Help with stairs or ramp for entrance;Assist for transportation   Can travel by private vehicle        Equipment Recommendations  Rolling walker (2 wheels)    Recommendations for Other Services       Precautions / Restrictions Precautions Precautions: Fall Precaution Comments: wound vac x 2 Restrictions Weight Bearing Restrictions: No     Mobility  Bed Mobility Overal bed mobility: Needs Assistance Bed Mobility: Supine to Sit, Sit to Supine     Supine to sit: Mod assist, +2 for physical assistance, Used rails, HOB elevated Sit to supine: Min assist, +2 for physical assistance, Used rails   General bed mobility comments: Able to perform supine>sit modA x2 for trunk support, hand placement, and BLE management;  sit>supine minA x2 for trunk support and BLE management to facilitate one full movement vs segmental; Pt reports increased 7-8/10 NPS B groin pain at end of session    Transfers Overall transfer level: Needs assistance Equipment used: Rolling walker (2 wheels) Transfers: Sit to/from Stand Sit to Stand: Supervision, From elevated surface           General transfer comment: Able to perform STS on elevated bed with RW supA; brief tactile and verbal cuing for hand placement to facilitate task; R foot block to prevent anterior slide    Ambulation/Gait Ambulation/Gait assistance: Contact guard assist Gait Distance (Feet): 5 Feet Assistive device: Rolling walker (2 wheels) Gait Pattern/deviations: Step-through pattern, Decreased step length - right, Decreased step length - left, Decreased stride length, Shuffle Gait velocity: Decreased   Pre-gait activities: Pt requires approx 3-4 minutes of standing time prior to amb to increase confidence and reduce slight symptoms of dizziness General Gait Details: Able to amb approx 45ft with RW CGA with one brief occurrence of LOB but able to self-correct; intermittent shuffling gait with reduced B knee flexion and frequent standing break secondary to pain   Stairs             Wheelchair Mobility     Tilt Bed    Modified Rankin (Stroke Patients Only)       Balance Overall balance assessment: Needs assistance Sitting-balance support: Feet supported, Bilateral upper extremity supported Sitting balance-Leahy Scale: Good Sitting balance - Comments: Able to maintain sitting EOB balance with BUE support during functional activities   Standing balance support: During functional activity, Bilateral upper extremity supported, Reliant on assistive device for balance Standing balance-Leahy Scale: Fair Standing balance  comment: heavy reliance on AD to maintain standing balance; slight slump posture which improved with cuing for upright posture                             Cognition Arousal: Alert Behavior During Therapy: WFL for tasks assessed/performed Overall Cognitive Status: Within Functional Limits for tasks assessed                                 General Comments: Pleasant and cooperative with PT session        Exercises Other Exercises Other Exercises: breathing techniques to prevent Valsalva during mobility    General Comments General comments (skin integrity, edema, etc.): B wound vacs intact pre/post session      Pertinent Vitals/Pain Pain Assessment Pain Assessment: 0-10 Pain Score: 7  Pain Location: L groin > R groin increased with mobility Pain Descriptors / Indicators: Burning, Grimacing, Operative site guarding Pain Intervention(s): Limited activity within patient's tolerance    Home Living                          Prior Function            PT Goals (current goals can now be found in the care plan section) Acute Rehab PT Goals Patient Stated Goal: to return home PT Goal Formulation: With patient Time For Goal Achievement: 05/03/23 Potential to Achieve Goals: Good Progress towards PT goals: Progressing toward goals    Frequency    Min 1X/week      PT Plan      Co-evaluation              AM-PAC PT "6 Clicks" Mobility   Outcome Measure  Help needed turning from your back to your side while in a flat bed without using bedrails?: A Little Help needed moving from lying on your back to sitting on the side of a flat bed without using bedrails?: A Little Help needed moving to and from a bed to a chair (including a wheelchair)?: A Little Help needed standing up from a chair using your arms (e.g., wheelchair or bedside chair)?: A Little Help needed to walk in hospital room?: A Little Help needed climbing 3-5 steps with a railing? : A Lot 6 Click Score: 17    End of Session Equipment Utilized During Treatment: Gait belt Activity Tolerance: Patient  limited by pain Patient left: in bed;with call bell/phone within reach;with bed alarm set;with family/visitor present Nurse Communication: Mobility status;Patient requests pain meds PT Visit Diagnosis: Muscle weakness (generalized) (M62.81);Unsteadiness on feet (R26.81);Pain Pain - Right/Left:  (Bilateral) Pain - part of body:  (Groin region)     Time: 1610-9604 PT Time Calculation (min) (ACUTE ONLY): 23 min  Charges:                            Elmon Else, SPT    Jory Tanguma 04/20/2023, 11:00 AM

## 2023-04-20 NOTE — Plan of Care (Signed)
  Problem: Education: Goal: Knowledge of General Education information will improve Description: Including pain rating scale, medication(s)/side effects and non-pharmacologic comfort measures Outcome: Progressing   Problem: Health Behavior/Discharge Planning: Goal: Ability to manage health-related needs will improve Outcome: Progressing   Problem: Clinical Measurements: Goal: Ability to maintain clinical measurements within normal limits will improve Outcome: Progressing Goal: Will remain free from infection Outcome: Progressing Goal: Diagnostic test results will improve Outcome: Progressing Goal: Respiratory complications will improve Outcome: Progressing Goal: Cardiovascular complication will be avoided Outcome: Progressing   Problem: Activity: Goal: Risk for activity intolerance will decrease Outcome: Progressing   Problem: Nutrition: Goal: Adequate nutrition will be maintained Outcome: Progressing   Problem: Coping: Goal: Level of anxiety will decrease Outcome: Progressing   Problem: Elimination: Goal: Will not experience complications related to bowel motility Outcome: Progressing Goal: Will not experience complications related to urinary retention Outcome: Progressing   Problem: Pain Management: Goal: General experience of comfort will improve Outcome: Progressing   Problem: Safety: Goal: Ability to remain free from injury will improve Outcome: Progressing   Problem: Skin Integrity: Goal: Risk for impaired skin integrity will decrease Outcome: Progressing   Problem: Education: Goal: Knowledge of prescribed regimen will improve Outcome: Progressing   Problem: Activity: Goal: Ability to tolerate increased activity will improve Outcome: Progressing   Problem: Bowel/Gastric: Goal: Gastrointestinal status for postoperative course will improve Outcome: Progressing   Problem: Clinical Measurements: Goal: Postoperative complications will be avoided or  minimized Outcome: Progressing Goal: Signs and symptoms of graft occlusion will improve Outcome: Progressing   Problem: Skin Integrity: Goal: Demonstration of wound healing without infection will improve Outcome: Progressing

## 2023-04-20 NOTE — Progress Notes (Signed)
[facility]  Daily Progress Note   Subjective  -   Doing well.  Pain from surgical sites.  Feet feel much better  Objective Vitals:   04/19/23 1531 04/19/23 1928 04/20/23 0412 04/20/23 0800  BP: 124/66 111/71 118/63 114/71  Pulse: 81 91 78 79  Resp: 18 18 20 18   Temp: 97.9 F (36.6 C) 98.1 F (36.7 C) 98.7 F (37.1 C) 98.7 F (37.1 C)  TempSrc:    Oral  SpO2: 90% 90% 92% 94%  Weight:      Height:        Intake/Output Summary (Last 24 hours) at 04/20/2023 0813 Last data filed at 04/19/2023 2015 Gross per 24 hour  Intake 400 ml  Output 875 ml  Net -475 ml    PULM  CTAB CV  RRR VASC  VAC with good seal, feet warm and well perfused  Laboratory CBC    Component Value Date/Time   WBC 13.0 (H) 04/19/2023 0031   HGB 12.3 (L) 04/19/2023 0031   HCT 37.3 (L) 04/19/2023 0031   PLT 188 04/19/2023 0031    BMET    Component Value Date/Time   NA 136 04/19/2023 0031   K 4.4 04/19/2023 0031   CL 106 04/19/2023 0031   CO2 24 04/19/2023 0031   GLUCOSE 189 (H) 04/19/2023 0031   BUN 24 (H) 04/19/2023 0031   CREATININE 1.27 (H) 04/19/2023 0031   CALCIUM 8.1 (L) 04/19/2023 0031   GFRNONAA >60 04/19/2023 0031   GFRAA >60 09/06/2017 0458    Assessment/Planning: POD #2 s/p bilateral femoral endarterectomies, right endovascular therapy  Doing well Increase activity Pain control is fair. Likely discharge in 1-2 days   Festus Barren  04/20/2023, 8:13 AM

## 2023-04-20 NOTE — Plan of Care (Signed)
Pt alert and oriented, S/P bilateral femoral endarterectomy and rt femoral stent place with wound vac placed to both surgical sites. Pt receiving Percocet PO prn Q4 hrs for pain.  Problem: Education: Goal: Knowledge of General Education information will improve Description: Including pain rating scale, medication(s)/side effects and non-pharmacologic comfort measures Outcome: Progressing   Problem: Health Behavior/Discharge Planning: Goal: Ability to manage health-related needs will improve Outcome: Progressing   Problem: Clinical Measurements: Goal: Ability to maintain clinical measurements within normal limits will improve Outcome: Progressing Goal: Will remain free from infection Outcome: Progressing Goal: Diagnostic test results will improve Outcome: Progressing Goal: Respiratory complications will improve Outcome: Progressing Goal: Cardiovascular complication will be avoided Outcome: Progressing   Problem: Activity: Goal: Risk for activity intolerance will decrease Outcome: Progressing   Problem: Nutrition: Goal: Adequate nutrition will be maintained Outcome: Progressing   Problem: Coping: Goal: Level of anxiety will decrease Outcome: Progressing   Problem: Elimination: Goal: Will not experience complications related to bowel motility Outcome: Progressing Goal: Will not experience complications related to urinary retention Outcome: Progressing   Problem: Pain Management: Goal: General experience of comfort will improve Outcome: Progressing   Problem: Safety: Goal: Ability to remain free from injury will improve Outcome: Progressing   Problem: Skin Integrity: Goal: Risk for impaired skin integrity will decrease Outcome: Progressing   Problem: Education: Goal: Knowledge of prescribed regimen will improve Outcome: Progressing   Problem: Activity: Goal: Ability to tolerate increased activity will improve Outcome: Progressing   Problem:  Bowel/Gastric: Goal: Gastrointestinal status for postoperative course will improve Outcome: Progressing   Problem: Clinical Measurements: Goal: Postoperative complications will be avoided or minimized Outcome: Progressing Goal: Signs and symptoms of graft occlusion will improve Outcome: Progressing   Problem: Skin Integrity: Goal: Demonstration of wound healing without infection will improve Outcome: Progressing

## 2023-04-21 LAB — CBC
HCT: 33.7 % — ABNORMAL LOW (ref 39.0–52.0)
Hemoglobin: 11.1 g/dL — ABNORMAL LOW (ref 13.0–17.0)
MCH: 31.2 pg (ref 26.0–34.0)
MCHC: 32.9 g/dL (ref 30.0–36.0)
MCV: 94.7 fL (ref 80.0–100.0)
Platelets: 175 10*3/uL (ref 150–400)
RBC: 3.56 MIL/uL — ABNORMAL LOW (ref 4.22–5.81)
RDW: 14.4 % (ref 11.5–15.5)
WBC: 9.6 10*3/uL (ref 4.0–10.5)
nRBC: 0 % (ref 0.0–0.2)

## 2023-04-21 LAB — COMPREHENSIVE METABOLIC PANEL
ALT: 18 U/L (ref 0–44)
AST: 21 U/L (ref 15–41)
Albumin: 3.2 g/dL — ABNORMAL LOW (ref 3.5–5.0)
Alkaline Phosphatase: 44 U/L (ref 38–126)
Anion gap: 8 (ref 5–15)
BUN: 26 mg/dL — ABNORMAL HIGH (ref 8–23)
CO2: 27 mmol/L (ref 22–32)
Calcium: 8.8 mg/dL — ABNORMAL LOW (ref 8.9–10.3)
Chloride: 99 mmol/L (ref 98–111)
Creatinine, Ser: 1.09 mg/dL (ref 0.61–1.24)
GFR, Estimated: >60 mL/min (ref >60)
Glucose, Bld: 179 mg/dL — ABNORMAL HIGH (ref 70–99)
Potassium: 3.8 mmol/L (ref 3.5–5.1)
Sodium: 134 mmol/L — ABNORMAL LOW (ref 135–145)
Total Bilirubin: 0.7 mg/dL (ref <1.2)
Total Protein: 6.2 g/dL — ABNORMAL LOW (ref 6.5–8.1)

## 2023-04-21 MED ORDER — MORPHINE SULFATE (PF) 2 MG/ML IV SOLN: 1.0000 mg | INTRAVENOUS | Status: DC | PRN

## 2023-04-21 MED ORDER — KETOROLAC TROMETHAMINE 30 MG/ML IJ SOLN
30.0000 mg | Freq: Once | INTRAMUSCULAR | Status: AC
  Administered 2023-04-21: 30 mg via INTRAVENOUS
  Filled 2023-04-21: qty 1

## 2023-04-21 MED ORDER — SODIUM CHLORIDE 0.9 % IV BOLUS
500.0000 mL | Freq: Once | INTRAVENOUS | Status: AC
  Administered 2023-04-21: 500 mL via INTRAVENOUS

## 2023-04-21 NOTE — Plan of Care (Addendum)
Patient is alert and oriented , he has been receiving prn medicine for pain. His blood pressure was 104/70 and complained of dizzy, got 500 ml bolus as order. Blood pressure rose to 124/81 . Wound vac placed on both surgical site.  Problem: Education: Goal: Knowledge of General Education information will improve Description: Including pain rating scale, medication(s)/side effects and non-pharmacologic comfort measures Outcome: Progressing   Problem: Health Behavior/Discharge Planning: Goal: Ability to manage health-related needs will improve Outcome: Progressing   Problem: Clinical Measurements: Goal: Ability to maintain clinical measurements within normal limits will improve Outcome: Progressing Goal: Will remain free from infection Outcome: Progressing Goal: Diagnostic test results will improve Outcome: Progressing Goal: Respiratory complications will improve Outcome: Progressing Goal: Cardiovascular complication will be avoided Outcome: Progressing   Problem: Activity: Goal: Risk for activity intolerance will decrease Outcome: Progressing   Problem: Nutrition: Goal: Adequate nutrition will be maintained Outcome: Progressing   Problem: Coping: Goal: Level of anxiety will decrease Outcome: Progressing   Problem: Elimination: Goal: Will not experience complications related to bowel motility Outcome: Progressing Goal: Will not experience complications related to urinary retention Outcome: Progressing   Problem: Pain Management: Goal: General experience of comfort will improve Outcome: Progressing   Problem: Safety: Goal: Ability to remain free from injury will improve Outcome: Progressing   Problem: Skin Integrity: Goal: Risk for impaired skin integrity will decrease Outcome: Progressing   Problem: Education: Goal: Knowledge of prescribed regimen will improve Outcome: Progressing   Problem: Activity: Goal: Ability to tolerate increased activity will  improve Outcome: Progressing   Problem: Bowel/Gastric: Goal: Gastrointestinal status for postoperative course will improve Outcome: Progressing   Problem: Clinical Measurements: Goal: Postoperative complications will be avoided or minimized Outcome: Progressing Goal: Signs and symptoms of graft occlusion will improve Outcome: Progressing   Problem: Skin Integrity: Goal: Demonstration of wound healing without infection will improve Outcome: Progressing

## 2023-04-21 NOTE — Plan of Care (Signed)
  Problem: Education: Goal: Knowledge of General Education information will improve Description: Including pain rating scale, medication(s)/side effects and non-pharmacologic comfort measures Outcome: Progressing   Problem: Health Behavior/Discharge Planning: Goal: Ability to manage health-related needs will improve Outcome: Progressing   Problem: Clinical Measurements: Goal: Ability to maintain clinical measurements within normal limits will improve Outcome: Progressing Goal: Will remain free from infection Outcome: Progressing Goal: Diagnostic test results will improve Outcome: Progressing Goal: Respiratory complications will improve Outcome: Progressing Goal: Cardiovascular complication will be avoided Outcome: Progressing   Problem: Activity: Goal: Risk for activity intolerance will decrease Outcome: Progressing   Problem: Nutrition: Goal: Adequate nutrition will be maintained Outcome: Progressing   Problem: Coping: Goal: Level of anxiety will decrease Outcome: Progressing   Problem: Elimination: Goal: Will not experience complications related to bowel motility Outcome: Progressing Goal: Will not experience complications related to urinary retention Outcome: Progressing   Problem: Pain Management: Goal: General experience of comfort will improve Outcome: Progressing   Problem: Safety: Goal: Ability to remain free from injury will improve Outcome: Progressing   Problem: Skin Integrity: Goal: Risk for impaired skin integrity will decrease Outcome: Progressing   Problem: Education: Goal: Knowledge of prescribed regimen will improve Outcome: Progressing   Problem: Activity: Goal: Ability to tolerate increased activity will improve Outcome: Progressing   Problem: Bowel/Gastric: Goal: Gastrointestinal status for postoperative course will improve Outcome: Progressing   Problem: Clinical Measurements: Goal: Postoperative complications will be avoided or  minimized Outcome: Progressing Goal: Signs and symptoms of graft occlusion will improve Outcome: Progressing   Problem: Skin Integrity: Goal: Demonstration of wound healing without infection will improve Outcome: Progressing

## 2023-04-21 NOTE — Progress Notes (Signed)
3 Days Post-Op   Subjective/Chief Complaint: Doing OK. States he feels dizzy this morning. SBP 100s, baseline 110-120s, notes mild groin discomfort. Denies chest pain, nausea. States he has not ambulated with PT much and feels weak still. Otherwise conversant. Wife at bedside.   Objective: Vital signs in last 24 hours: Temp:  [98 F (36.7 C)-98.4 F (36.9 C)] 98 F (36.7 C) (11/16 0753) Pulse Rate:  [71-90] 76 (11/16 0753) Resp:  [16-18] 16 (11/16 0753) BP: (104-120)/(59-81) 104/70 (11/16 0753) SpO2:  [91 %-94 %] 92 % (11/16 0753) Last BM Date : 04/18/23  Intake/Output from previous day: 11/15 0701 - 11/16 0700 In: 400 [P.O.:400] Out: 425 [Urine:425] Intake/Output this shift: No intake/output data recorded.  General appearance: alert and no distress Resp: clear to auscultation bilaterally Cardio: regular rate and rhythm Extremities: Bilateral groin Prevena in place with good seal, soft, legs/feet warm, well perfused  Lab Results:  Recent Labs    04/18/23 1339 04/19/23 0031  WBC 11.8* 13.0*  HGB 11.7* 12.3*  HCT 35.6* 37.3*  PLT 162 188   BMET Recent Labs    04/18/23 1339 04/19/23 0031  NA  --  136  K  --  4.4  CL  --  106  CO2  --  24  GLUCOSE  --  189*  BUN  --  24*  CREATININE 1.26* 1.27*  CALCIUM  --  8.1*   PT/INR No results for input(s): "LABPROT", "INR" in the last 72 hours. ABG No results for input(s): "PHART", "HCO3" in the last 72 hours.  Invalid input(s): "PCO2", "PO2"  Studies/Results: No results found.  Anti-infectives: Anti-infectives (From admission, onward)    Start     Dose/Rate Route Frequency Ordered Stop   04/18/23 2000  ceFAZolin (ANCEF) IVPB 2g/100 mL premix        2 g 200 mL/hr over 30 Minutes Intravenous Every 8 hours 04/18/23 1314 04/19/23 0616   04/18/23 1132  gentamicin (GARAMYCIN) injection  Status:  Discontinued          As needed 04/18/23 1132 04/18/23 1234   04/18/23 1132  vancomycin (VANCOCIN) powder  Status:   Discontinued          As needed 04/18/23 1133 04/18/23 1234   04/18/23 0615  ceFAZolin (ANCEF) IVPB 2g/100 mL premix        2 g 200 mL/hr over 30 Minutes Intravenous On call to O.R. 04/18/23 0609 04/18/23 1206       Assessment/Plan: s/p Procedure(s): ENDARTERECTOMY FEMORAL (RIGHT SFA STENT) (POSSIBLE RIGHT FEM-POP BYPASS) (Bilateral) APPLICATION OF CELL SAVER (Bilateral) Hold antihypertensives NS 500 ml bolus Check CBC/CMP OOB to chair later   LOS: 3 days    Eli Hose A 04/21/2023

## 2023-04-21 NOTE — Progress Notes (Signed)
Physical Therapy Treatment Patient Details Name: Phillip Mendez MRN: [id number] DOB: 13-Dec-1950 Today's Date: 04/21/2023   History of Present Illness Patient is a 72 year old male s/p bilateral femoral endarterectomy    PT Comments  Pt received seated in recliner upon arrival to room and pt agreeable to therapy.  Pt notes he is feeling a little better than yesterday, but still experiencing some dizziness.  Pt performed transfers with good technique and utilization of the AD.  Pt then ambulated tot he nursing station outside of the room before returning to the bed.  Pt did have several rest breaks and would pause whenever he felt dizzy, signifying an increased safety awareness.  Pt also noting burning sensation in the B LE's when walking.  Pt elected to return to the bed and was left with all needs met and call bell within reach.     If plan is discharge home, recommend the following: A little help with walking and/or transfers;A little help with bathing/dressing/bathroom;Assistance with cooking/housework;Supervision due to cognitive status;Help with stairs or ramp for entrance;Assist for transportation   Can travel by private vehicle        Equipment Recommendations  Rolling walker (2 wheels)    Recommendations for Other Services       Precautions / Restrictions Precautions Precautions: Fall Precaution Comments: wound vac x 2 Restrictions Weight Bearing Restrictions: No     Mobility  Bed Mobility           Sit to supine: Mod assist, Used rails   General bed mobility comments: Pt upright in recliner upon arrival and returned to bed.  Needed modA for R LE to get back into bed.    Transfers Overall transfer level: Needs assistance Equipment used: Rolling walker (2 wheels) Transfers: Sit to/from Stand Sit to Stand: Supervision, From elevated surface                Ambulation/Gait Ambulation/Gait assistance: Contact guard assist Gait Distance (Feet): 20  Feet Assistive device: Rolling walker (2 wheels) Gait Pattern/deviations: Step-through pattern, Decreased step length - right, Decreased step length - left, Decreased stride length, Shuffle Gait velocity: Decreased     General Gait Details: Pt required several rest breaks to continue walking, noting dizziness and burning sensation in the LE's.   Stairs             Wheelchair Mobility     Tilt Bed    Modified Rankin (Stroke Patients Only)       Balance Overall balance assessment: Needs assistance Sitting-balance support: Feet supported, Bilateral upper extremity supported Sitting balance-Leahy Scale: Good Sitting balance - Comments: Able to maintain sitting EOB balance with BUE support during functional activities   Standing balance support: During functional activity, Bilateral upper extremity supported, Reliant on assistive device for balance Standing balance-Leahy Scale: Fair Standing balance comment: heavy reliance on AD to maintain standing balance; slight slump posture which improved with cuing for upright posture                            Cognition Arousal: Alert Behavior During Therapy: WFL for tasks assessed/performed Overall Cognitive Status: Within Functional Limits for tasks assessed                                 General Comments: Pleasant and cooperative with PT session        Exercises  General Comments        Pertinent Vitals/Pain Pain Assessment Pain Assessment: Faces Faces Pain Scale: Hurts little more Pain Location: LE's burning Pain Descriptors / Indicators: Burning, Grimacing, Operative site guarding Pain Intervention(s): Limited activity within patient's tolerance    Home Living                          Prior Function            PT Goals (current goals can now be found in the care plan section) Acute Rehab PT Goals Patient Stated Goal: to return home PT Goal Formulation: With  patient Time For Goal Achievement: 05/03/23 Potential to Achieve Goals: Good Progress towards PT goals: Progressing toward goals    Frequency    Min 1X/week      PT Plan      Co-evaluation              AM-PAC PT "6 Clicks" Mobility   Outcome Measure  Help needed turning from your back to your side while in a flat bed without using bedrails?: A Little Help needed moving from lying on your back to sitting on the side of a flat bed without using bedrails?: A Little Help needed moving to and from a bed to a chair (including a wheelchair)?: A Little Help needed standing up from a chair using your arms (e.g., wheelchair or bedside chair)?: A Little Help needed to walk in hospital room?: A Little Help needed climbing 3-5 steps with a railing? : A Lot 6 Click Score: 17    End of Session Equipment Utilized During Treatment: Gait belt Activity Tolerance: Patient limited by pain;Treatment limited secondary to medical complications (Comment) Patient left: in bed;with call bell/phone within reach;with bed alarm set;with family/visitor present Nurse Communication: Mobility status;Patient requests pain meds PT Visit Diagnosis: Muscle weakness (generalized) (M62.81);Unsteadiness on feet (R26.81);Pain     Time: 1517-1540 PT Time Calculation (min) (ACUTE ONLY): 23 min  Charges:    $Therapeutic Activity: 23-37 mins PT General Charges $$ ACUTE PT VISIT: 1 Visit                     Nolon Bussing, PT, DPT Physical Therapist - [facility]  04/21/23, 4:13 PM

## 2023-04-22 NOTE — Plan of Care (Addendum)
Patient is alert and oriented X 4.wound vac placed on both surgical site.   Problem: Education: Goal: Knowledge of General Education information will improve Description: Including pain rating scale, medication(s)/side effects and non-pharmacologic comfort measures Outcome: Progressing   Problem: Health Behavior/Discharge Planning: Goal: Ability to manage health-related needs will improve Outcome: Progressing   Problem: Clinical Measurements: Goal: Ability to maintain clinical measurements within normal limits will improve Outcome: Progressing Goal: Will remain free from infection Outcome: Progressing Goal: Diagnostic test results will improve Outcome: Progressing Goal: Respiratory complications will improve Outcome: Progressing Goal: Cardiovascular complication will be avoided Outcome: Progressing   Problem: Activity: Goal: Risk for activity intolerance will decrease Outcome: Progressing   Problem: Nutrition: Goal: Adequate nutrition will be maintained Outcome: Progressing   Problem: Coping: Goal: Level of anxiety will decrease Outcome: Progressing   Problem: Elimination: Goal: Will not experience complications related to bowel motility Outcome: Progressing Goal: Will not experience complications related to urinary retention Outcome: Progressing   Problem: Pain Management: Goal: General experience of comfort will improve Outcome: Progressing   Problem: Safety: Goal: Ability to remain free from injury will improve Outcome: Progressing   Problem: Skin Integrity: Goal: Risk for impaired skin integrity will decrease Outcome: Progressing   Problem: Education: Goal: Knowledge of prescribed regimen will improve Outcome: Progressing   Problem: Activity: Goal: Ability to tolerate increased activity will improve Outcome: Progressing   Problem: Bowel/Gastric: Goal: Gastrointestinal status for postoperative course will improve Outcome: Progressing   Problem:  Clinical Measurements: Goal: Postoperative complications will be avoided or minimized Outcome: Progressing Goal: Signs and symptoms of graft occlusion will improve Outcome: Progressing   Problem: Skin Integrity: Goal: Demonstration of wound healing without infection will improve Outcome: Progressing

## 2023-04-22 NOTE — Progress Notes (Signed)
PT Cancellation Note  Patient Details Name: Phillip Mendez MRN: [id number] DOB: 02/02/1951   Cancelled Treatment:    Reason Eval/Treat Not Completed: Fatigue/lethargy limiting ability to participate  Pt stated he was up in chair several times today and recently returned to bed.  Dizziness remains.  Declined further session at this time,   Danielle Dess 04/22/2023, 2:51 PM

## 2023-04-22 NOTE — Plan of Care (Signed)
  Problem: Education: Goal: Knowledge of General Education information will improve Description: Including pain rating scale, medication(s)/side effects and non-pharmacologic comfort measures Outcome: Progressing   Problem: Health Behavior/Discharge Planning: Goal: Ability to manage health-related needs will improve Outcome: Progressing   Problem: Clinical Measurements: Goal: Ability to maintain clinical measurements within normal limits will improve Outcome: Progressing Goal: Will remain free from infection Outcome: Progressing Goal: Diagnostic test results will improve Outcome: Progressing Goal: Respiratory complications will improve Outcome: Progressing Goal: Cardiovascular complication will be avoided Outcome: Progressing   Problem: Activity: Goal: Risk for activity intolerance will decrease Outcome: Progressing   Problem: Nutrition: Goal: Adequate nutrition will be maintained Outcome: Progressing   Problem: Coping: Goal: Level of anxiety will decrease Outcome: Progressing   Problem: Elimination: Goal: Will not experience complications related to bowel motility Outcome: Progressing Goal: Will not experience complications related to urinary retention Outcome: Progressing   Problem: Pain Management: Goal: General experience of comfort will improve Outcome: Progressing   Problem: Safety: Goal: Ability to remain free from injury will improve Outcome: Progressing   Problem: Skin Integrity: Goal: Risk for impaired skin integrity will decrease Outcome: Progressing   Problem: Education: Goal: Knowledge of prescribed regimen will improve Outcome: Progressing   Problem: Activity: Goal: Ability to tolerate increased activity will improve Outcome: Progressing   Problem: Bowel/Gastric: Goal: Gastrointestinal status for postoperative course will improve Outcome: Progressing   Problem: Clinical Measurements: Goal: Postoperative complications will be avoided or  minimized Outcome: Progressing Goal: Signs and symptoms of graft occlusion will improve Outcome: Progressing   Problem: Skin Integrity: Goal: Demonstration of wound healing without infection will improve Outcome: Progressing

## 2023-04-22 NOTE — Progress Notes (Signed)
4 Days Post-Op   Subjective/Chief Complaint: Feeling better. Up in chair. Denies further dizziness. SBP 120s, Able to ambulate with PT yesterday. Pain at incision sites. Unchanged.   Objective: Vital signs in last 24 hours: Temp:  [98 F (36.7 C)-98.8 F (37.1 C)] 98.3 F (36.8 C) (11/17 0545) Pulse Rate:  [76-89] 76 (11/17 0545) Resp:  [16-20] 20 (11/16 2244) BP: (114-125)/(64-81) 125/76 (11/17 0545) SpO2:  [90 %-93 %] 93 % (11/17 0545) Last BM Date : 04/18/23  Intake/Output from previous day: No intake/output data recorded. Intake/Output this shift: No intake/output data recorded.  General appearance: alert and no distress Cardio: regular rate and rhythm Extremities: warm, edema bilateral lower extremities, Prevena VACs in place with good seal, feet warm  Lab Results:  Recent Labs    04/21/23 1050  WBC 9.6  HGB 11.1*  HCT 33.7*  PLT 175   BMET Recent Labs    04/21/23 1050  NA 134*  K 3.8  CL 99  CO2 27  GLUCOSE 179*  BUN 26*  CREATININE 1.09  CALCIUM 8.8*   PT/INR No results for input(s): "LABPROT", "INR" in the last 72 hours. ABG No results for input(s): "PHART", "HCO3" in the last 72 hours.  Invalid input(s): "PCO2", "PO2"  Studies/Results: No results found.  Anti-infectives: Anti-infectives (From admission, onward)    Start     Dose/Rate Route Frequency Ordered Stop   04/18/23 2000  ceFAZolin (ANCEF) IVPB 2g/100 mL premix        2 g 200 mL/hr over 30 Minutes Intravenous Every 8 hours 04/18/23 1314 04/19/23 0616   04/18/23 1132  gentamicin (GARAMYCIN) injection  Status:  Discontinued          As needed 04/18/23 1132 04/18/23 1234   04/18/23 1132  vancomycin (VANCOCIN) powder  Status:  Discontinued          As needed 04/18/23 1133 04/18/23 1234   04/18/23 0615  ceFAZolin (ANCEF) IVPB 2g/100 mL premix        2 g 200 mL/hr over 30 Minutes Intravenous On call to O.R. 04/18/23 0609 04/18/23 1206       Assessment/Plan: s/p  Procedure(s): ENDARTERECTOMY FEMORAL (RIGHT SFA STENT) (POSSIBLE RIGHT FEM-POP BYPASS) (Bilateral) APPLICATION OF CELL SAVER (Bilateral) Continue OOB with PT May require a few more sessions with PT then Home with Home Health- possibly on Tuesday Resume antihypertensives  LOS: 4 days    Eli Hose A 04/22/2023

## 2023-04-22 NOTE — TOC Initial Note (Signed)
Transition of Care [facility]) - Initial/Assessment Note    Patient Details  Name: Phillip Mendez MRN: [id number] Date of Birth: Oct 27, 1950  Transition of Care [facility]) CM/SW Contact:    Luvenia Redden, RN Phone Number: 04/22/2023, 11:52 AM  Clinical Narrative:                  New orders for recommended HHPT/OT. Spoke with pt and explained the purpose for the call and recommendations for [facility]. Pt has history with [facility]. RN spoke with Herbert Seta who accepted this pt for services. Pt has supportive wife Phillip Mendez and very independent with his ADLS. Pt verified sufficient transportation to his medical appointments, able to obtain all her medications and has transportation available upon his discharge from the hospital.  DME via [employer] will be requested closer to discharged at least 48 hrs prior for coverage for RW and the recommended tube chair pt will have out-of-pocket ([employer]).        Patient Goals and CMS Choice            Expected Discharge Plan and Services                                              Prior Living Arrangements/Services                       Activities of Daily Living   ADL Screening (condition at time of admission) Independently performs ADLs?: Yes (appropriate for developmental age) Is the patient deaf or have difficulty hearing?: No Does the patient have difficulty seeing, even when wearing glasses/contacts?: No Does the patient have difficulty concentrating, remembering, or making decisions?: No  Permission Sought/Granted                  Emotional Assessment              Admission diagnosis:  Atherosclerosis of artery of extremity with rest pain [facility]) [I70.229] Patient Active Problem List   Diagnosis Date Noted   Atherosclerosis of artery of extremity with rest pain (HCC) 04/18/2023   Atherosclerosis of native arteries of extremity with rest pain (HCC) 02/27/2023   Atherosclerosis of native arteries of extremity  with intermittent claudication (HCC) 11/29/2021   Essential hypertension 11/29/2021   Tobacco use disorder 11/29/2021   Diabetes mellitus type 2 with complications (HCC) 03/28/2021   CAD (coronary artery disease) 01/09/2019   Chronic bronchitis (HCC) 01/09/2019   Post-traumatic osteoarthritis of right hip 09/04/2017   Atherosclerosis of abdominal aorta (HCC) 08/14/2017   PCP:  Lauro Regulus, MD Pharmacy:   [facility] [phone] [place], [place] - [address] [place] [address] Phone: [phone] Fax: [phone]     Social Determinants of Health (SDOH) Social History: SDOH Screenings   Food Insecurity: No Food Insecurity (04/18/2023)  Housing: Low Risk  (04/18/2023)  Transportation Needs: No Transportation Needs (04/18/2023)  Utilities: Not At Risk (04/18/2023)  Tobacco Use: High Risk (04/18/2023)   SDOH Interventions:     Readmission Risk Interventions     No data to display

## 2023-04-23 MED ORDER — OXYCODONE-ACETAMINOPHEN 5-325 MG PO TABS
1.0000 | ORAL_TABLET | ORAL | Status: DC | PRN
  Filled 2023-04-23: qty 1

## 2023-04-23 MED ORDER — OXYCODONE-ACETAMINOPHEN 5-325 MG PO TABS
1.0000 | ORAL_TABLET | Freq: Four times a day (QID) | ORAL | Status: DC | PRN
  Administered 2023-04-23: 1 via ORAL

## 2023-04-23 MED ORDER — OXYCODONE-ACETAMINOPHEN 5-325 MG PO TABS: 1.0000 | ORAL_TABLET | Freq: Four times a day (QID) | ORAL | 0 refills | Status: DC | PRN

## 2023-04-23 MED ORDER — CLOPIDOGREL BISULFATE 75 MG PO TABS: 75.0000 mg | ORAL_TABLET | Freq: Every day | ORAL | 11 refills | Status: AC

## 2023-04-23 NOTE — Discharge Instructions (Addendum)
Do not drive for the next 2 weeks until seen in follow-up and staples have been removed.  Do not lift anything heavy.  Do not lift anything more than a gallon of milk.  Do not exercise.  We do want you to ambulate 4-5 times a day.  You are being discharged on all your home medications except for amlodipine your blood pressure medication.  Please do not take this medication for 1 week and follow-up with your local primary care provider for blood pressure check and medication review.  You are also being discharged on aspirin 81 mg daily and Plavix 75 mg daily and Crestor 20 mg daily.  Please make sure you take these medications on a regular basis to ensure that your surgery remains successful.  Follow-up in vein and vascular surgery clinic for suture removal and testing in 2 weeks as scheduled.

## 2023-04-23 NOTE — Progress Notes (Signed)
Progress Note    04/23/2023 11:00 AM 5 Days Post-Op  Subjective:  [name] is a 72 yo male who is now POD #5 from Bilateral femoral, profunda femoris and SFA endarterectomies with angioplasty and stent placement to the right.   On exam this morning patient is resting comfortably in bed. Bilateral Prevena wound vacs in each groin are intact and working well. Patient does endorse some pain to both areas but states it is tolerable. Patient was worried about the pulses in his feet so I used a Doppler to Doppler them. He was able to listen and understand that he has very strong Doppler pulses bilaterally and his dorsalis pedis and his posttibial arteries.  Patient continues to complain of dizziness upon ambulation and swollen lower extremities this morning.  No other complaints overnight. Vitals are stable.    Vitals:   04/23/23 0501 04/23/23 0900  BP: 123/70 121/72  Pulse: 70 74  Resp:    Temp: 97.6 F (36.4 C)   SpO2: 94% 96%   Physical Exam: Cardiac:  RRR, normal S1 and S2.  No murmurs Lungs: Clear on auscultation throughout but diminished in the bases.  No rales rhonchi or wheezing noted normal respiratory effort. Incisions: Bilateral groin incisions with Prevena wound vacs in place.  Working well.  No complications to the wound. Extremities: Bilateral lower extremities warm to touch this morning.  Toes have good capillary refill.  Doppler pulses very strong bilaterally both DP/PT. Bilateral lower extremity swelling +2 this morning Abdomen: Positive bowel sounds throughout, soft, nontender and nondistended. Neurologic: Alert and oriented x 4, answers all questions and follows commands appropriately.  CBC    Component Value Date/Time   WBC 9.6 04/21/2023 1050   RBC 3.56 (L) 04/21/2023 1050   HGB 11.1 (L) 04/21/2023 1050   HCT 33.7 (L) 04/21/2023 1050   PLT 175 04/21/2023 1050   MCV 94.7 04/21/2023 1050   MCH 31.2 04/21/2023 1050   MCHC 32.9 04/21/2023 1050   RDW 14.4  04/21/2023 1050   LYMPHSABS 2.0 04/16/2023 1205   MONOABS 0.6 04/16/2023 1205   EOSABS 0.2 04/16/2023 1205   BASOSABS 0.0 04/16/2023 1205    BMET    Component Value Date/Time   NA 134 (L) 04/21/2023 1050   K 3.8 04/21/2023 1050   CL 99 04/21/2023 1050   CO2 27 04/21/2023 1050   GLUCOSE 179 (H) 04/21/2023 1050   BUN 26 (H) 04/21/2023 1050   CREATININE 1.09 04/21/2023 1050   CALCIUM 8.8 (L) 04/21/2023 1050   GFRNONAA >60 04/21/2023 1050   GFRAA >60 09/06/2017 0458    INR    Component Value Date/Time   INR 0.88 08/29/2017 0826     Intake/Output Summary (Last 24 hours) at 04/23/2023 1100 Last data filed at 04/23/2023 1059 Gross per 24 hour  Intake 360 ml  Output 300 ml  Net 60 ml     Assessment/Plan:  72 y.o. male is s/p Bilateral femoral, profunda femoris and SFA endarterectomies with angioplasty and stent placement to the right.  5 Days Post-Op   PLAN: Decreased pain medication OXY to 1 tablet every 6 hours due to dizziness and overall feeling to tired to participate with PT. Discontinued patients home Amlodipine as it can cause lower extremity edema and be driving BP too low. Continue to work with PT/OT.   DVT prophylaxis:   ASA 81 mg Daily and Plavix 75 mg Daily    Sallyann Kinnaird [facility] 04/23/2023 11:00 AM

## 2023-04-23 NOTE — Progress Notes (Signed)
Physical Therapy Treatment Patient Details Name: Phillip Mendez MRN: [id number] DOB: [birth year] Today's Date: 04/23/2023   History of Present Illness Patient is a 72 year old male s/p bilateral femoral endarterectomy    PT Comments  Pt alert and oriented and very pleasant to work with. B/L wound vacs intact pre/post session and denies dizziness t/o. Pt found on 3L O2 via [place] upon entry; receptive to using room air for session 2/2 no O2 use at baseline; SpO2 > 92% on room air t/o session; pt left on 3L O2 via [place] at end of session 2/2 pt request. Pt denies pain upon entry, with an increase to 7/10 LLE pain upon walking, but subsided at end of session with pt in bed. Pt performed bed mob with supervision, STS from low bed with CGA and RW, and increased amb this session with minA and RW. Pt very talkative and required multiple standing rest breaks d/t fatigue during ambulation. Pt would benefit from continued PT to maximize functional independence and decrease caregiver burden. D/c recommendation remains appropriate.   If plan is discharge home, recommend the following: A little help with walking and/or transfers;A little help with bathing/dressing/bathroom;Assistance with cooking/housework;Supervision due to cognitive status;Help with stairs or ramp for entrance;Assist for transportation   Can travel by private vehicle      yes  Equipment Recommendations  Rolling walker (2 wheels)    Recommendations for Other Services       Precautions / Restrictions Precautions Precautions: Fall Precaution Comments: wound vac x 2 Restrictions Weight Bearing Restrictions: No     Mobility  Bed Mobility Overal bed mobility: Needs Assistance Bed Mobility: Supine to Sit, Sit to Supine     Supine to sit: HOB elevated, Used rails, Contact guard Sit to supine: Contact guard assist, HOB elevated, Used rails        Transfers Overall transfer level: Needs assistance Equipment used: Rolling walker (2  wheels) Transfers: Sit to/from Stand Sit to Stand: Contact guard assist (from low bed)                Ambulation/Gait Ambulation/Gait assistance: Min assist Gait Distance (Feet): 60 Feet Assistive device: Rolling walker (2 wheels) Gait Pattern/deviations: Step-through pattern, Decreased step length - right, Decreased step length - left, Decreased stride length, Shuffle Gait velocity: Decreased     General Gait Details: Pt required several standing rest breaks to continue walking (very talkative), denied dizziness, reported 7/10 LLE pain during walking (subsided at end of session), minA 2/2 decreased step height/length and increased fatigue   Stairs             Wheelchair Mobility     Tilt Bed    Modified Rankin (Stroke Patients Only)       Balance Overall balance assessment: Needs assistance Sitting-balance support: Feet supported, Bilateral upper extremity supported Sitting balance-Leahy Scale: Fair     Standing balance support: During functional activity, Bilateral upper extremity supported, Reliant on assistive device for balance Standing balance-Leahy Scale: Fair Standing balance comment: heavy reliance on AD to maintain standing balance; slight slump posture, but reported that he liked the height of his RW                            Cognition Arousal: Alert Behavior During Therapy: WFL for tasks assessed/performed Overall Cognitive Status: Within Functional Limits for tasks assessed  General Comments: Pleasant and cooperative with PT session        Exercises      General Comments General comments (skin integrity, edema, etc.): B/L wound vacs intact pre/post session      Pertinent Vitals/Pain Pain Assessment Pain Assessment: 0-10 Pain Score: 7  (7/10 with walking; 0/10 at end of session) Pain Location: LLE Pain Descriptors / Indicators: Aching, Discomfort Pain Intervention(s): Limited  activity within patient's tolerance, Repositioned, Monitored during session, Premedicated before session    Home Living                          Prior Function            PT Goals (current goals can now be found in the care plan section) Acute Rehab PT Goals Patient Stated Goal: to return home PT Goal Formulation: With patient Time For Goal Achievement: 05/03/23 Potential to Achieve Goals: Good Progress towards PT goals: Progressing toward goals    Frequency    Min 1X/week      PT Plan      Co-evaluation              AM-PAC PT "6 Clicks" Mobility   Outcome Measure  Help needed turning from your back to your side while in a flat bed without using bedrails?: A Little Help needed moving from lying on your back to sitting on the side of a flat bed without using bedrails?: A Little Help needed moving to and from a bed to a chair (including a wheelchair)?: A Little Help needed standing up from a chair using your arms (e.g., wheelchair or bedside chair)?: A Little Help needed to walk in hospital room?: A Little Help needed climbing 3-5 steps with a railing? : A Lot 6 Click Score: 17    End of Session Equipment Utilized During Treatment: Gait belt Activity Tolerance: Patient tolerated treatment well;Patient limited by fatigue;Patient limited by pain Patient left: in bed;with call bell/phone within reach;with bed alarm set;with family/visitor present (3L O2 via [place]) Nurse Communication: Mobility status PT Visit Diagnosis: Muscle weakness (generalized) (M62.81);Unsteadiness on feet (R26.81)     Time: 4540-9811 PT Time Calculation (min) (ACUTE ONLY): 28 min  Charges:    $Gait Training: 8-22 mins $Therapeutic Activity: 8-22 mins PT General Charges $$ ACUTE PT VISIT: 1 Visit                        Shauna Hugh, SPT 04/23/2023, 12:40 PM

## 2023-04-23 NOTE — TOC Transition Note (Signed)
Transition of Care [facility]) - Progression Note    Patient Details  Name: Phillip Mendez MRN: [id number] Date of Birth: [birth year]  Transition of Care [facility]) CM/SW Contact  Garret Reddish, RN Phone Number: 04/23/2023, 3:42 PM  Clinical Narrative:    Chart reviewed.  Noted that patient has orders for discharge today.    I have made Morrie Sheldon aware with [facility] that patient will be a discharge home for today.  [facility] will provide home health PT for patient.   I have asked John with [employer] to provide patient with 2 wheeled rolling walker for home use.    I have informed staff nurse of the above information.          Barriers to Discharge: No Barriers Identified  Expected Discharge Plan and Services         Expected Discharge Date: 04/23/23               DME Arranged: Dan Humphreys rolling DME Agency: [employer] Date DME Agency Contacted: 04/23/23 Time DME Agency Contacted: 1530 Representative spoke with at DME Agency: Esperanza Richters Arranged: PT HH Agency: [facility] ([facility]) Date HH Agency Contacted: 04/22/23       Social Determinants of Health (SDOH) Interventions SDOH Screenings   Food Insecurity: No Food Insecurity (04/18/2023)  Housing: Low Risk  (04/18/2023)  Transportation Needs: No Transportation Needs (04/18/2023)  Utilities: Not At Risk (04/18/2023)  Tobacco Use: High Risk (04/18/2023)    Readmission Risk Interventions     No data to display

## 2023-04-23 NOTE — Discharge Summary (Signed)
[facility]    Discharge Summary    Patient ID:  Phillip Mendez MRN: [id number] DOB/AGE: February 24, 1951 72 y.o.  Admit date: 04/18/2023 Discharge date: 04/23/2023 Date of Surgery: 04/18/2023 Surgeon: Surgeon(s): Schnier, Latina Craver, MD Annice Needy, MD  Admission Diagnosis: Atherosclerosis of artery of extremity with rest pain [facility]) [I70.229]  Discharge Diagnoses:  Atherosclerosis of artery of extremity with rest pain [facility]) [I70.229]  Secondary Diagnoses: Past Medical History:  Diagnosis Date   Arthritis    Atherosclerosis of abdominal aorta (HCC)    Atherosclerosis of native arteries of extremity with intermittent claudication (HCC)    Atherosclerosis of native arteries of extremity with rest pain (HCC)    Chronic bronchitis (HCC)    Coronary artery disease    Diabetes mellitus type 2 with complications (HCC)    GERD (gastroesophageal reflux disease)    Hypertension    Post-traumatic osteoarthritis of right hip     Procedure(s): ENDARTERECTOMY FEMORAL (RIGHT SFA STENT) (POSSIBLE RIGHT FEM-POP BYPASS) APPLICATION OF CELL SAVER  Discharged Condition: good  HPI:  Phillip Mendez is a 72 year old male who was admitted to [facility] for bilateral femoral endarterectomies with angioplasty and stent placement on 04/18/2023.  Patient is now postop day 5 and recovering as expected.  Patient has bilateral Prevena wound vacs in both right and left groins that are working well.  Patient is ambulating, eating and urinating well today.  Patient endorses having some pain to both incision sites but is very tolerable.  Patient endorses he has bilateral lower extremity swelling which is more than normal.  Patient was taking amlodipine 5 mg daily which can do this so this was stopped today upon discharge.  Patient was instructed to follow-up with his primary care physician in 1 week for blood pressure check and review of all of his blood pressure medications.   He verbalizes understanding.  Patient will be sent home on dual anticoagulation platelet therapy which will include aspirin 81 mg daily, Plavix 75 mg daily and Crestor 20 mg daily.  Patient instructed its imperative that he takes these medications to defer any complications.  Hospital Course:  Phillip Mendez is a 71 y.o. male is S/P Bilateral femoral, profunda femoris and SFA endarterectomies with angioplasty and stent placement to the right.  Extubated: POD # 0 Physical Exam:  Alert notes x3, no acute distress Face: Symmetrical.  Tongue is midline. Neck: Trachea is midline.  No swelling or bruising. Cardiovascular: Regular rate and rhythm Pulmonary: Clear to auscultation bilaterally Abdomen: Soft, nontender, nondistended Right groin access: Clean dry and intact.  No swelling or drainage noted Prevena Wound Vac In place and working well.  Left groin access: Clean dry and intact.  No swelling or drainage noted. Prevena Wound Vac in place and working well.  Left lower extremity: Thigh soft.  Calf soft.  Extremities warm distally toes. Hard to palpate pedal pulses however the foot is warm is her good capillary refill. Right lower extremity: Thigh soft.  Calf soft.  Extremities warm distally toes. Hard to palpate pedal pulses however the foot is warm is her good capillary refill. Neurological: No deficits noted   Post-op wounds:  clean, dry, intact or healing well  Pt. Ambulating, voiding and taking PO diet without difficulty. Pt pain controlled with PO pain meds.  Labs:  As below  Complications: none  Consults:    Significant Diagnostic Studies: CBC Lab Results  Component Value Date   WBC 9.6 04/21/2023   HGB 11.1 (  L) 04/21/2023   HCT 33.7 (L) 04/21/2023   MCV 94.7 04/21/2023   PLT 175 04/21/2023    BMET    Component Value Date/Time   NA 134 (L) 04/21/2023 1050   K 3.8 04/21/2023 1050   CL 99 04/21/2023 1050   CO2 27 04/21/2023 1050   GLUCOSE 179 (H) 04/21/2023 1050    BUN 26 (H) 04/21/2023 1050   CREATININE 1.09 04/21/2023 1050   CALCIUM 8.8 (L) 04/21/2023 1050   GFRNONAA >60 04/21/2023 1050   GFRAA >60 09/06/2017 0458   COAG Lab Results  Component Value Date   INR 0.88 08/29/2017     Disposition:  Discharge to :Home  Allergies as of 04/23/2023   No Known Allergies      Medication List     STOP taking these medications    amLODipine 5 MG tablet Commonly known as: NORVASC       TAKE these medications    acetaminophen 325 MG tablet Commonly known as: TYLENOL Take 1-2 tablets (325-650 mg total) by mouth every 6 (six) hours as needed for mild pain (pain score 1-3 or temp > 100.5).   albuterol 108 (90 Base) MCG/ACT inhaler Commonly known as: VENTOLIN HFA Inhale 2 puffs into the lungs every 6 (six) hours as needed for wheezing or shortness of breath.   aspirin EC 81 MG tablet Take 81 mg by mouth daily. Swallow whole.   calcium carbonate 500 MG chewable tablet Commonly known as: TUMS - dosed in mg elemental calcium Chew 2 tablets by mouth daily as needed for indigestion or heartburn.   clopidogrel 75 MG tablet Commonly known as: PLAVIX Take 1 tablet (75 mg total) by mouth daily at 6 (six) AM. Start taking on: April 24, 2023   famotidine 20 MG tablet Commonly known as: PEPCID Take 20 mg by mouth daily as needed for heartburn or indigestion.   Incruse Ellipta 62.5 MCG/ACT Aepb Generic drug: umeclidinium bromide Inhale 1 puff into the lungs daily.   losartan-hydrochlorothiazide 100-12.5 MG tablet Commonly known as: HYZAAR Take 1 tablet by mouth daily.   oxyCODONE-acetaminophen 5-325 MG tablet Commonly known as: PERCOCET/ROXICET Take 1 tablet by mouth every 6 (six) hours as needed for moderate pain (pain score 4-6).   rosuvastatin 20 MG tablet Commonly known as: CRESTOR Take 20 mg by mouth daily.       Verbal and written Discharge instructions given to the patient. Wound care per Discharge AVS  Follow-up  Information     Georgiana Spinner, NP Follow up in 2 week(s).   Specialty: Vascular Surgery Why: Suture Removal and Bilateral lower extremity Ultrassounds with ABI's Contact information: [address] [place] [address] [phone]                 Signed: Marcie Bal, NP  04/23/2023, 3:02 PM

## 2023-04-23 NOTE — Progress Notes (Signed)
OT Cancellation Note  Patient Details Name: Altonio Kuznia MRN: [id number] DOB: 02-24-1951   Cancelled Treatment:    Reason Eval/Treat Not Completed: Other (comment). Upon attempt, pt eager to discharge later today and denies additional acute OT needs.   Arman Filter., MPH, MS, OTR/L ascom [phone] 04/23/23, 3:47 PM

## 2023-04-23 NOTE — Care Management Important Message (Signed)
Important Message  Patient Details  Name: Phillip Mendez MRN: [id number] Date of Birth: February 06, 1951   Important Message Given:  Yes - Medicare IM     Olegario Messier A Jaylend Reiland 04/23/2023, 2:56 PM

## 2023-04-27 ENCOUNTER — Telehealth (INDEPENDENT_AMBULATORY_CARE_PROVIDER_SITE_OTHER): Payer: Self-pay

## 2023-04-27 NOTE — Telephone Encounter (Signed)
Patient left a message informing that is he notice last night that the right leg has some discoloration and clear/yellow weeping below the right knee. Also he is having right leg swelling that is twice the normal size. Patient informed that he is elevating to help with swelling. Patient had a right femoral endarterectomy on 04/18/23. Please Advise

## 2023-04-30 ENCOUNTER — Other Ambulatory Visit (INDEPENDENT_AMBULATORY_CARE_PROVIDER_SITE_OTHER): Payer: Self-pay | Admitting: Vascular Surgery

## 2023-04-30 DIAGNOSIS — Z9889 Other specified postprocedural states: Secondary | ICD-10-CM

## 2023-04-30 NOTE — Telephone Encounter (Signed)
Let's see how the weeping is and swelling is.  I'm not surprised about the swelling as that does happen after this surgery especially once you leave the hospital and are a little more active.  This happens because there's more blood flow going to the leg in addition to some inflammation.  The swelling can take a few weeks to resolve but wearing compression  in addition to elevation is helpful

## 2023-04-30 NOTE — Telephone Encounter (Signed)
Patient notified with medical recommendations and verbalized understanding

## 2023-05-02 ENCOUNTER — Other Ambulatory Visit (INDEPENDENT_AMBULATORY_CARE_PROVIDER_SITE_OTHER): Payer: Self-pay | Admitting: Nurse Practitioner

## 2023-05-02 ENCOUNTER — Telehealth (INDEPENDENT_AMBULATORY_CARE_PROVIDER_SITE_OTHER): Payer: Self-pay

## 2023-05-02 MED ORDER — OXYCODONE-ACETAMINOPHEN 5-325 MG PO TABS: 1.0000 | ORAL_TABLET | Freq: Four times a day (QID) | ORAL | 0 refills | Status: DC | PRN

## 2023-05-02 NOTE — Telephone Encounter (Signed)
Patient left a message requesting for refill for Oxycodone. Please Advise

## 2023-05-02 NOTE — Telephone Encounter (Signed)
sent 

## 2023-05-02 NOTE — Telephone Encounter (Signed)
Patient notified that refill has been sent

## 2023-05-07 ENCOUNTER — Ambulatory Visit (INDEPENDENT_AMBULATORY_CARE_PROVIDER_SITE_OTHER): Payer: Medicare HMO

## 2023-05-07 DIAGNOSIS — I739 Peripheral vascular disease, unspecified: Secondary | ICD-10-CM

## 2023-05-07 DIAGNOSIS — Z9889 Other specified postprocedural states: Secondary | ICD-10-CM | POA: Diagnosis not present

## 2023-05-10 ENCOUNTER — Other Ambulatory Visit (INDEPENDENT_AMBULATORY_CARE_PROVIDER_SITE_OTHER): Payer: Self-pay | Admitting: Nurse Practitioner

## 2023-05-10 ENCOUNTER — Telehealth (INDEPENDENT_AMBULATORY_CARE_PROVIDER_SITE_OTHER): Payer: Self-pay

## 2023-05-10 ENCOUNTER — Ambulatory Visit: Payer: Medicare HMO

## 2023-05-10 ENCOUNTER — Other Ambulatory Visit (INDEPENDENT_AMBULATORY_CARE_PROVIDER_SITE_OTHER): Payer: Medicare HMO

## 2023-05-10 ENCOUNTER — Other Ambulatory Visit: Payer: Self-pay | Admitting: Internal Medicine

## 2023-05-10 DIAGNOSIS — M79605 Pain in left leg: Secondary | ICD-10-CM

## 2023-05-10 DIAGNOSIS — M7989 Other specified soft tissue disorders: Secondary | ICD-10-CM

## 2023-05-10 DIAGNOSIS — M79604 Pain in right leg: Secondary | ICD-10-CM

## 2023-05-10 DIAGNOSIS — R600 Localized edema: Secondary | ICD-10-CM

## 2023-05-10 MED ORDER — OXYCODONE-ACETAMINOPHEN 5-325 MG PO TABS: 1.0000 | ORAL_TABLET | Freq: Four times a day (QID) | ORAL | 0 refills | Status: DC | PRN

## 2023-05-10 MED ORDER — APIXABAN 5 MG PO TABS: 5.0000 mg | ORAL_TABLET | Freq: Two times a day (BID) | ORAL | 3 refills | Status: AC

## 2023-05-10 NOTE — Telephone Encounter (Signed)
Patient will be here at 10 am for a venous duplex. Information given to Phillip Mendez to put him in.

## 2023-05-10 NOTE — Telephone Encounter (Signed)
Amil Amen called on the behalf of Dr.Anderson at [facility].  She stated that Vencil is currently in the clinic and has swelling and pain in the groin area. The pain is going down his right leg. Both side are hurting him, but more the right than left.  She is requesting a venous doppler today on his legs, she thinks he may have a blood clot. Also, he is requesting more pain medication.   As requested Dr.Andersons number is [phone] Theresa Duty is [phone]

## 2023-05-10 NOTE — Telephone Encounter (Signed)
Per- Vivia [place] Check with Selena Batten to see if the can get him in today.   Per Selena Batten- If they fax [place] an [place] order and call the front to schedule it we can.  We have 10:00 and 2:00 slots.  Called Amil Amen and let the message and fax number

## 2023-05-15 LAB — VAS US ABI WITH/WO TBI
Left ABI: 0.91
Right ABI: 1.09

## 2023-05-21 ENCOUNTER — Other Ambulatory Visit: Payer: Self-pay | Admitting: Internal Medicine

## 2023-05-21 ENCOUNTER — Telehealth (INDEPENDENT_AMBULATORY_CARE_PROVIDER_SITE_OTHER): Payer: Self-pay

## 2023-05-21 ENCOUNTER — Other Ambulatory Visit (INDEPENDENT_AMBULATORY_CARE_PROVIDER_SITE_OTHER): Payer: Self-pay | Admitting: Nurse Practitioner

## 2023-05-21 DIAGNOSIS — G8194 Hemiplegia, unspecified affecting left nondominant side: Secondary | ICD-10-CM

## 2023-05-21 MED ORDER — OXYCODONE-ACETAMINOPHEN 5-325 MG PO TABS: 1.0000 | ORAL_TABLET | Freq: Four times a day (QID) | ORAL | 0 refills | Status: DC | PRN

## 2023-05-21 NOTE — Telephone Encounter (Signed)
sent 

## 2023-05-21 NOTE — Telephone Encounter (Signed)
Patient called requesting a refill of pain medication. Oxycodone 5 mg

## 2023-05-21 NOTE — Telephone Encounter (Signed)
Patient notified

## 2023-05-22 ENCOUNTER — Ambulatory Visit (INDEPENDENT_AMBULATORY_CARE_PROVIDER_SITE_OTHER): Payer: Medicare HMO | Admitting: Vascular Surgery

## 2023-05-22 ENCOUNTER — Encounter (INDEPENDENT_AMBULATORY_CARE_PROVIDER_SITE_OTHER): Payer: Self-pay | Admitting: Vascular Surgery

## 2023-05-22 VITALS — BP 131/78 | HR 85 | Ht 70.0 in | Wt 208.0 lb

## 2023-05-22 DIAGNOSIS — E118 Type 2 diabetes mellitus with unspecified complications: Secondary | ICD-10-CM

## 2023-05-22 DIAGNOSIS — I10 Essential (primary) hypertension: Secondary | ICD-10-CM

## 2023-05-22 DIAGNOSIS — I70229 Atherosclerosis of native arteries of extremities with rest pain, unspecified extremity: Secondary | ICD-10-CM

## 2023-05-22 NOTE — Assessment & Plan Note (Signed)
blood pressure control important in reducing the progression of atherosclerotic disease. On appropriate oral medications.  

## 2023-05-22 NOTE — Progress Notes (Signed)
Patient ID: Phillip Mendez, male   DOB: [birth year], 72 y.o.   MRN: [id number]  Chief Complaint  Patient presents with   Follow-up    2 weeks fu + Ab    HPI Phillip Mendez is a 72 y.o. male.  Patient returns in follow-up.  He still having a lot of right leg pain and swelling.  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  He is taking that as well as Plavix.  His ABIs earlier this month showed normal right ABI of 1.12 and a slightly reduced left ABI of 0.9.  His incisions are healing well and we are going to remove his staples and sutures today.  No fevers or chills.  Still having a lot of neuropathic pain in the right leg from reperfusion and has pretty marked swelling.   Past Medical History:  Diagnosis Date   Arthritis    Atherosclerosis of abdominal aorta (HCC)    Atherosclerosis of native arteries of extremity with intermittent claudication (HCC)    Atherosclerosis of native arteries of extremity with rest pain (HCC)    Chronic bronchitis (HCC)    Coronary artery disease    Diabetes mellitus type 2 with complications (HCC)    GERD (gastroesophageal reflux disease)    Hypertension    Post-traumatic osteoarthritis of right hip     Past Surgical History:  Procedure Laterality Date   ENDARTERECTOMY FEMORAL Bilateral 04/18/2023   Procedure: ENDARTERECTOMY FEMORAL (RIGHT SFA STENT) (POSSIBLE RIGHT FEM-POP BYPASS);  Surgeon: Annice Needy, MD;  Location: [facility];  Service: Vascular;  Laterality: Bilateral;   FRACTURE SURGERY Left    wrist   HERNIA REPAIR Bilateral    groin   LOWER EXTREMITY ANGIOGRAPHY Right 03/15/2023   Procedure: Lower Extremity Angiography;  Surgeon: Annice Needy, MD;  Location: [facility];  Service: Cardiovascular;  Laterality: Right;   MUSCLE REPAIR Right    arm and leg   TOTAL HIP ARTHROPLASTY Right 09/04/2017   Procedure: TOTAL HIP ARTHROPLASTY ANTERIOR APPROACH;  Surgeon: Kennedy Bucker, MD;  Location: [facility];   Service: Orthopedics;  Laterality: Right;      No Known Allergies  Current Outpatient Medications  Medication Sig Dispense Refill   acetaminophen (TYLENOL) 325 MG tablet Take 1-2 tablets (325-650 mg total) by mouth every 6 (six) hours as needed for mild pain (pain score 1-3 or temp > 100.5).     albuterol (VENTOLIN HFA) 108 (90 Base) MCG/ACT inhaler Inhale 2 puffs into the lungs every 6 (six) hours as needed for wheezing or shortness of breath. 8 g 2   apixaban (ELIQUIS) 5 MG TABS tablet Take 1 tablet (5 mg total) by mouth 2 (two) times daily. 60 tablet 3   aspirin EC 81 MG tablet Take 81 mg by mouth daily. Swallow whole.     calcium carbonate (TUMS - DOSED IN MG ELEMENTAL CALCIUM) 500 MG chewable tablet Chew 2 tablets by mouth daily as needed for indigestion or heartburn.     clopidogrel (PLAVIX) 75 MG tablet Take 1 tablet (75 mg total) by mouth daily at 6 (six) AM. 30 tablet 11   famotidine (PEPCID) 20 MG tablet Take 20 mg by mouth daily as needed for heartburn or indigestion.     INCRUSE ELLIPTA 62.5 MCG/ACT AEPB Inhale 1 puff into the lungs daily.     losartan-hydrochlorothiazide (HYZAAR) 100-12.5 MG tablet Take 1 tablet by mouth daily.     oxyCODONE-acetaminophen (PERCOCET/ROXICET) 5-325  MG tablet Take 1 tablet by mouth every 6 (six) hours as needed for moderate pain (pain score 4-6). 30 tablet 0   rosuvastatin (CRESTOR) 20 MG tablet Take 20 mg by mouth daily.     terbinafine (LAMISIL) 250 MG tablet Take by mouth.     No current facility-administered medications for this visit.        Physical Exam BP 131/78   Pulse 85   Ht 5\' 10"  (1.778 m)   Wt 208 lb (94.3 kg)   BMI 29.84 kg/m  Gen:  WD/WN, NAD Skin: incision C/D/I. Staples and sutures removed today.  Right leg with 2-3+ edema.     Assessment/Plan:  Essential hypertension blood pressure control important in reducing the progression of atherosclerotic disease. On appropriate oral medications.   Diabetes mellitus  type 2 with complications (HCC) blood glucose control important in reducing the progression of atherosclerotic disease. Also, involved in wound healing. On appropriate medications.   Atherosclerosis of artery of extremity with rest pain (HCC) Perfusion is markedly improved.  Patient has significant reperfusion swelling that was likely exacerbated somewhat by calf vein DVT.  He can do Eliquis and aspirin and stop the Plavix at this point.  He will return in 2 to 3 months with ABIs.  He can try to increase his activity as tolerated and should wear compression socks and elevate his legs for the swelling.  Sutures and staples were removed today and Steri-Strips were placed.  No activity restrictions at this point.  He still has significant pain and may need another refill on his pain medication which is reasonable.      Phillip Mendez 05/22/2023, 4:38 PM   This note was created with Dragon medical transcription system.  Any errors from dictation are unintentional.

## 2023-05-22 NOTE — Assessment & Plan Note (Signed)
Perfusion is markedly improved.  Patient has significant reperfusion swelling that was likely exacerbated somewhat by calf vein DVT.  He can do Eliquis and aspirin and stop the Plavix at this point.  He will return in 2 to 3 months with ABIs.  He can try to increase his activity as tolerated and should wear compression socks and elevate his legs for the swelling.  Sutures and staples were removed today and Steri-Strips were placed.  No activity restrictions at this point.  He still has significant pain and may need another refill on his pain medication which is reasonable.

## 2023-05-22 NOTE — Assessment & Plan Note (Signed)
blood glucose control important in reducing the progression of atherosclerotic disease. Also, involved in wound healing. On appropriate medications.  

## 2023-05-28 ENCOUNTER — Other Ambulatory Visit (INDEPENDENT_AMBULATORY_CARE_PROVIDER_SITE_OTHER): Payer: Self-pay | Admitting: Nurse Practitioner

## 2023-05-28 ENCOUNTER — Telehealth (INDEPENDENT_AMBULATORY_CARE_PROVIDER_SITE_OTHER): Payer: Self-pay

## 2023-05-28 MED ORDER — OXYCODONE-ACETAMINOPHEN 5-325 MG PO TABS: 1.0000 | ORAL_TABLET | Freq: Four times a day (QID) | ORAL | 0 refills | Status: DC | PRN

## 2023-05-28 NOTE — Telephone Encounter (Signed)
Patient was notified.

## 2023-05-28 NOTE — Telephone Encounter (Signed)
Patient left a message requesting for refill for pain medication. Please Advise

## 2023-05-28 NOTE — Telephone Encounter (Signed)
Sent!

## 2023-06-04 ENCOUNTER — Ambulatory Visit
Admission: RE | Admit: 2023-06-04 | Discharge: 2023-06-04 | Disposition: A | Discharge status: Home / Self Care | Payer: Medicare HMO | Source: Ambulatory Visit | Attending: Internal Medicine | Admitting: Internal Medicine

## 2023-06-04 DIAGNOSIS — G8194 Hemiplegia, unspecified affecting left nondominant side: Secondary | ICD-10-CM | POA: Insufficient documentation

## 2023-06-04 MED ORDER — GADOBUTROL 1 MMOL/ML IV SOLN
9.0000 mL | Freq: Once | INTRAVENOUS | Status: AC | PRN
  Administered 2023-06-04: 9 mL via INTRAVENOUS

## 2023-06-07 ENCOUNTER — Other Ambulatory Visit (INDEPENDENT_AMBULATORY_CARE_PROVIDER_SITE_OTHER): Payer: Self-pay | Admitting: Nurse Practitioner

## 2023-06-07 ENCOUNTER — Telehealth (INDEPENDENT_AMBULATORY_CARE_PROVIDER_SITE_OTHER): Payer: Self-pay

## 2023-06-07 MED ORDER — OXYCODONE-ACETAMINOPHEN 5-325 MG PO TABS: 1.0000 | ORAL_TABLET | Freq: Four times a day (QID) | ORAL | 0 refills | Status: DC | PRN

## 2023-06-07 NOTE — Telephone Encounter (Signed)
 Patient has been notified

## 2023-06-07 NOTE — Telephone Encounter (Signed)
 Patient left a message requesting for pain medication refill for pain relief. Please Advise

## 2023-06-07 NOTE — Telephone Encounter (Signed)
 sent

## 2023-06-14 ENCOUNTER — Other Ambulatory Visit (INDEPENDENT_AMBULATORY_CARE_PROVIDER_SITE_OTHER): Payer: Self-pay | Admitting: Nurse Practitioner

## 2023-06-14 ENCOUNTER — Telehealth (INDEPENDENT_AMBULATORY_CARE_PROVIDER_SITE_OTHER): Payer: Self-pay

## 2023-06-14 NOTE — Telephone Encounter (Signed)
 Patient requesting for pain medication refill he has enough until tomorrow. Please Advise

## 2023-06-15 ENCOUNTER — Other Ambulatory Visit (INDEPENDENT_AMBULATORY_CARE_PROVIDER_SITE_OTHER): Payer: Self-pay | Admitting: Nurse Practitioner

## 2023-06-15 MED ORDER — OXYCODONE-ACETAMINOPHEN 5-325 MG PO TABS: 1.0000 | ORAL_TABLET | Freq: Three times a day (TID) | ORAL | 0 refills | Status: DC | PRN

## 2023-06-15 NOTE — Telephone Encounter (Signed)
 I will send in refill but he is about two months out from his surgery, so we will start to taper his pain medication

## 2023-06-15 NOTE — Telephone Encounter (Signed)
 Patient was notified with medical recommendations and verbalized understanding

## 2023-06-21 ENCOUNTER — Telehealth (INDEPENDENT_AMBULATORY_CARE_PROVIDER_SITE_OTHER): Payer: Self-pay

## 2023-06-21 NOTE — Telephone Encounter (Signed)
Patient left a message asking if he should continue taking Eliquis and also if he could get a different pain medication other than Oxycodone. Please Advise

## 2023-06-21 NOTE — Telephone Encounter (Signed)
Patient was notified with medical recommendations and verbalized understanding. Patient is fine with Hydrocodone being sent to pharmacy. Patient did state that he will run out on Saturday and requested medication to be sent tomorrow.

## 2023-06-21 NOTE — Telephone Encounter (Signed)
Because he has a blood clot, he will be on eliquis for at least 6 months.  As far as the pain medication, he can have hydrocodone, but again, because his surgery was two months ago, we will not supply this long term

## 2023-06-22 ENCOUNTER — Other Ambulatory Visit (INDEPENDENT_AMBULATORY_CARE_PROVIDER_SITE_OTHER): Payer: Self-pay | Admitting: Nurse Practitioner

## 2023-06-22 ENCOUNTER — Telehealth (INDEPENDENT_AMBULATORY_CARE_PROVIDER_SITE_OTHER): Payer: Self-pay

## 2023-06-22 MED ORDER — HYDROCODONE-ACETAMINOPHEN 5-325 MG PO TABS: 1.0000 | ORAL_TABLET | Freq: Three times a day (TID) | ORAL | 0 refills | Status: AC | PRN

## 2023-06-22 NOTE — Telephone Encounter (Signed)
Patient was notified with medical recommendations and verbalized understanding

## 2023-06-22 NOTE — Telephone Encounter (Signed)
He can contact his insurance to see if they may pay more for xarelto, but typically for treatment of DVT the options are eliquis or xarelto.  In addition he can apply to contact eliquis to see if he can get his medication free or at a reduced cost.  He can call them at [phone].  Also, I have sent in hydrocodone for him.  Per Dr. Wyn Quaker, he will be able to get one more refill after this.

## 2023-06-22 NOTE — Telephone Encounter (Signed)
Patient left a message stating that he is not able to afford Eliquis at the cost of $160. Patient is asking if there is alternative blood thinner that more affordable. Please Advise

## 2023-08-30 ENCOUNTER — Other Ambulatory Visit (INDEPENDENT_AMBULATORY_CARE_PROVIDER_SITE_OTHER): Payer: Self-pay | Admitting: Vascular Surgery

## 2023-08-30 DIAGNOSIS — I82462 Acute embolism and thrombosis of left calf muscular vein: Secondary | ICD-10-CM

## 2023-08-30 DIAGNOSIS — Z9889 Other specified postprocedural states: Secondary | ICD-10-CM

## 2023-08-31 ENCOUNTER — Ambulatory Visit (INDEPENDENT_AMBULATORY_CARE_PROVIDER_SITE_OTHER)

## 2023-08-31 ENCOUNTER — Encounter (INDEPENDENT_AMBULATORY_CARE_PROVIDER_SITE_OTHER): Payer: Self-pay | Admitting: Vascular Surgery

## 2023-08-31 ENCOUNTER — Ambulatory Visit (INDEPENDENT_AMBULATORY_CARE_PROVIDER_SITE_OTHER): Payer: Medicare HMO

## 2023-08-31 ENCOUNTER — Ambulatory Visit (INDEPENDENT_AMBULATORY_CARE_PROVIDER_SITE_OTHER): Payer: Medicare HMO | Admitting: Vascular Surgery

## 2023-08-31 VITALS — BP 129/78 | HR 66 | Resp 16 | Wt 203.4 lb

## 2023-08-31 DIAGNOSIS — Z9889 Other specified postprocedural states: Secondary | ICD-10-CM | POA: Diagnosis not present

## 2023-08-31 DIAGNOSIS — I890 Lymphedema, not elsewhere classified: Secondary | ICD-10-CM

## 2023-08-31 DIAGNOSIS — I82462 Acute embolism and thrombosis of left calf muscular vein: Secondary | ICD-10-CM | POA: Diagnosis not present

## 2023-08-31 DIAGNOSIS — I70229 Atherosclerosis of native arteries of extremities with rest pain, unspecified extremity: Secondary | ICD-10-CM

## 2023-08-31 DIAGNOSIS — E118 Type 2 diabetes mellitus with unspecified complications: Secondary | ICD-10-CM | POA: Diagnosis not present

## 2023-08-31 DIAGNOSIS — G629 Polyneuropathy, unspecified: Secondary | ICD-10-CM | POA: Diagnosis not present

## 2023-08-31 DIAGNOSIS — I739 Peripheral vascular disease, unspecified: Secondary | ICD-10-CM

## 2023-08-31 DIAGNOSIS — I10 Essential (primary) hypertension: Secondary | ICD-10-CM | POA: Diagnosis not present

## 2023-08-31 MED ORDER — GABAPENTIN 300 MG PO CAPS: 300.0000 mg | ORAL_CAPSULE | Freq: Every day | ORAL | 5 refills | Status: AC

## 2023-08-31 NOTE — Assessment & Plan Note (Signed)
 ABIs today are 1.12 on the right and 0.89 on the left.  Duplex lower extremity shows femoral endarterectomy and SFA stents to be widely patent.  The discomfort he seems to be having now is likely neuropathic in nature and we will try some gabapentin.  Follow-up in 6 months with noninvasive studies.

## 2023-08-31 NOTE — Assessment & Plan Note (Signed)
 The patient has developed chronic swelling of the right lower extremity after his major vascular surgery.  Compression and elevation have not alleviated the symptoms over the past 3 to 4 months.  A lymphedema pump would be an excellent adjuvant therapy to help with the swelling at this point.

## 2023-08-31 NOTE — Assessment & Plan Note (Signed)
 He clearly has neuropathic pain in the lower extremities.  I am going to start him on 300 mg at night.  This should help him sleep and help alleviate some of his discomfort.

## 2023-08-31 NOTE — Progress Notes (Signed)
 MRN : [id number]  Phillip Mendez is a 73 y.o. ([birth year]) male who presents with chief complaint of  Chief Complaint  Patient presents with   Follow-up    3 month abi/lea follow up  .  History of Present Illness: Patient returns today in follow up of his PAD and rest pain.  He had extensive right lower extremity revascularization with hybrid procedure consisting of right femoral endarterectomy and extensive infrainguinal revascularization endoluminally.  He still has a fair bit of swelling despite compression and elevation of the right lower extremity.  His biggest complaint currently is of neuropathic type pain in both lower extremities.  He gets sharp stinging pains particularly at night and he is having difficulty sleeping.  He denies any open wounds or infection.  His groin wound has well-healed.  His swelling has gradually gotten better but continues to be bothersome.  ABIs today are 1.12 on the right and 0.89 on the left.  Duplex lower extremity shows femoral endarterectomy and SFA stents to be widely patent.  Current Outpatient Medications  Medication Sig Dispense Refill   acetaminophen (TYLENOL) 325 MG tablet Take 1-2 tablets (325-650 mg total) by mouth every 6 (six) hours as needed for mild pain (pain score 1-3 or temp > 100.5).     albuterol (VENTOLIN HFA) 108 (90 Base) MCG/ACT inhaler Inhale 2 puffs into the lungs every 6 (six) hours as needed for wheezing or shortness of breath. 8 g 2   aspirin EC 81 MG tablet Take 81 mg by mouth daily. Swallow whole.     calcium carbonate (TUMS - DOSED IN MG ELEMENTAL CALCIUM) 500 MG chewable tablet Chew 2 tablets by mouth daily as needed for indigestion or heartburn.     clopidogrel (PLAVIX) 75 MG tablet Take 1 tablet (75 mg total) by mouth daily at 6 (six) AM. 30 tablet 11   famotidine (PEPCID) 20 MG tablet Take 20 mg by mouth daily as needed for heartburn or indigestion.     Fluticasone-Umeclidin-Vilant 100-62.5-25 MCG/ACT AEPB Inhale 1 puff  into the lungs daily.     gabapentin (NEURONTIN) 300 MG capsule Take 1 capsule (300 mg total) by mouth at bedtime. 30 capsule 5   HYDROcodone-acetaminophen (NORCO/VICODIN) 5-325 MG tablet Take 1 tablet by mouth every 8 (eight) hours as needed for severe pain (pain score 7-10). (Patient not taking: Reported on 08/31/2023) 20 tablet 0   losartan-hydrochlorothiazide (HYZAAR) 100-12.5 MG tablet Take 1 tablet by mouth daily.     rosuvastatin (CRESTOR) 20 MG tablet Take 20 mg by mouth daily.     terbinafine (LAMISIL) 250 MG tablet Take by mouth.     apixaban (ELIQUIS) 5 MG TABS tablet Take 1 tablet (5 mg total) by mouth 2 (two) times daily. (Patient not taking: Reported on 08/31/2023) 60 tablet 3   INCRUSE ELLIPTA 62.5 MCG/ACT AEPB Inhale 1 puff into the lungs daily. (Patient not taking: Reported on 08/31/2023)     No current facility-administered medications for this visit.    Past Medical History:  Diagnosis Date   Arthritis    Atherosclerosis of abdominal aorta (HCC)    Atherosclerosis of native arteries of extremity with intermittent claudication (HCC)    Atherosclerosis of native arteries of extremity with rest pain (HCC)    Chronic bronchitis (HCC)    Coronary artery disease    Diabetes mellitus type 2 with complications (HCC)    GERD (gastroesophageal reflux disease)    Hypertension    Post-traumatic osteoarthritis of right hip  Past Surgical History:  Procedure Laterality Date   ENDARTERECTOMY FEMORAL Bilateral 04/18/2023   Procedure: ENDARTERECTOMY FEMORAL (RIGHT SFA STENT) (POSSIBLE RIGHT FEM-POP BYPASS);  Surgeon: Annice Needy, MD;  Location: [facility];  Service: Vascular;  Laterality: Bilateral;   FRACTURE SURGERY Left    wrist   HERNIA REPAIR Bilateral    groin   LOWER EXTREMITY ANGIOGRAPHY Right 03/15/2023   Procedure: Lower Extremity Angiography;  Surgeon: Annice Needy, MD;  Location: [facility];  Service: Cardiovascular;  Laterality: Right;   MUSCLE REPAIR Right     arm and leg   TOTAL HIP ARTHROPLASTY Right 09/04/2017   Procedure: TOTAL HIP ARTHROPLASTY ANTERIOR APPROACH;  Surgeon: Kennedy Bucker, MD;  Location: [facility];  Service: Orthopedics;  Laterality: Right;     Social History   Tobacco Use   Smoking status: Every Day    Current packs/day: 0.50    Average packs/day: 1 pack/day for 43.2 years (42.1 ttl pk-yrs)    Types: Cigarettes    Start date: 2023   Smokeless tobacco: Never  Vaping Use   Vaping status: Never Used  Substance Use Topics   Alcohol use: Yes    Alcohol/week: 18.0 standard drinks of alcohol    Types: 18 Cans of beer per week   Drug use: Never       Family History  Problem Relation Age of Onset   Cancer Brother      No Known Allergies   REVIEW OF SYSTEMS (Negative unless checked)   Constitutional: [] Weight loss  [] Fever  [] Chills Cardiac: [] Chest pain   [] Chest pressure   [] Palpitations   [] Shortness of breath when laying flat   [] Shortness of breath at rest   [] Shortness of breath with exertion. Vascular:  [x] Pain in legs with walking   [] Pain in legs at rest   [] Pain in legs when laying flat   [x] Claudication   [] Pain in feet when walking  [x] Pain in feet at rest  [] Pain in feet when laying flat   [] History of DVT   [] Phlebitis   [x] Swelling in legs   [] Varicose veins   [] Non-healing ulcers Pulmonary:   [] Uses home oxygen   [] Productive cough   [] Hemoptysis   [] Wheeze  [] COPD   [] Asthma Neurologic:  [] Dizziness  [] Blackouts   [] Seizures   [] History of stroke   [] History of TIA  [] Aphasia   [] Temporary blindness   [] Dysphagia   [] Weakness or numbness in arms   [] Weakness or numbness in legs Musculoskeletal:  [x] Arthritis   [] Joint swelling   [] Joint pain   [] Low back pain Hematologic:  [] Easy bruising  [] Easy bleeding   [] Hypercoagulable state   [] Anemic   Gastrointestinal:  [] Blood in stool   [] Vomiting blood  [x] Gastroesophageal reflux/heartburn   [] Abdominal pain Genitourinary:  [] Chronic kidney disease    [] Difficult urination  [] Frequent urination  [] Burning with urination   [] Hematuria Skin:  [] Rashes   [] Ulcers   [] Wounds Psychological:  [] History of anxiety   []  History of major depression.  Physical Examination  BP 129/78   Pulse 66   Resp 16   Wt 203 lb 6.4 oz (92.3 kg)   BMI 29.18 kg/m  Gen:  WD/WN, NAD Head: [place]/AT, No temporalis wasting. Ear/Nose/Throat: Hearing grossly intact, nares w/o erythema or drainage Eyes: Conjunctiva clear. Sclera non-icteric Neck: Supple.  Trachea midline Pulmonary:  Good air movement, no use of accessory muscles.  Cardiac: RRR, no JVD Vascular:  Vessel Right Left  Radial Palpable Palpable  PT 1+ Palpable 1+ Palpable  DP 1+ Palpable 1+ Palpable   Gastrointestinal: soft, non-tender/non-distended. No guarding/reflex.  Musculoskeletal: M/S 5/5 throughout.  No deformity or atrophy. 1-2+ RLE edema. Neurologic: Sensation grossly intact in extremities.  Symmetrical.  Speech is fluent.  Psychiatric: Judgment intact, Mood & affect appropriate for pt's clinical situation. Dermatologic: No rashes or ulcers noted.  No cellulitis or open wounds.      Labs No results found for this or any previous visit (from the past 2160 hours).  Radiology No results found.  Assessment/Plan  Neuropathy He clearly has neuropathic pain in the lower extremities.  I am going to start him on 300 mg at night.  This should help him sleep and help alleviate some of his discomfort.  Lymphedema The patient has developed chronic swelling of the right lower extremity after his major vascular surgery.  Compression and elevation have not alleviated the symptoms over the past 3 to 4 months.  A lymphedema pump would be an excellent adjuvant therapy to help with the swelling at this point.  Atherosclerosis of artery of extremity with rest pain (HCC) ABIs today are 1.12 on the right and 0.89 on the left.  Duplex lower extremity shows femoral  endarterectomy and SFA stents to be widely patent.  The discomfort he seems to be having now is likely neuropathic in nature and we will try some gabapentin.  Follow-up in 6 months with noninvasive studies.  Essential hypertension blood pressure control important in reducing the progression of atherosclerotic disease. On appropriate oral medications.       Diabetes mellitus type 2 with complications (HCC) blood glucose control important in reducing the progression of atherosclerotic disease. Also, involved in wound healing. On appropriate medications.    Festus Barren, MD  08/31/2023 12:32 PM    This note was created with Dragon medical transcription system.  Any errors from dictation are purely unintentional

## 2023-09-03 LAB — VAS US ABI WITH/WO TBI
Left ABI: 0.89
Right ABI: 1.12

## 2023-09-10 ENCOUNTER — Telehealth (INDEPENDENT_AMBULATORY_CARE_PROVIDER_SITE_OTHER): Payer: Self-pay

## 2023-09-10 NOTE — Telephone Encounter (Signed)
 Patient reach out to the office checking on the status for gabapentin prescription and lymphedema pump. Patient was informed that prescription was sent to [facility] on 08/31/23. Also the lymph pump company will contact him once the order has went through insurance

## 2023-10-23 ENCOUNTER — Encounter (INDEPENDENT_AMBULATORY_CARE_PROVIDER_SITE_OTHER): Payer: Self-pay

## 2024-01-15 ENCOUNTER — Other Ambulatory Visit: Payer: Self-pay | Admitting: Specialist

## 2024-01-15 DIAGNOSIS — F17210 Nicotine dependence, cigarettes, uncomplicated: Secondary | ICD-10-CM

## 2024-02-29 ENCOUNTER — Ambulatory Visit
Admission: RE | Admit: 2024-02-29 | Discharge: 2024-02-29 | Disposition: A | Discharge status: Home / Self Care | Source: Ambulatory Visit | Attending: Specialist | Admitting: Specialist

## 2024-02-29 DIAGNOSIS — F17210 Nicotine dependence, cigarettes, uncomplicated: Secondary | ICD-10-CM | POA: Diagnosis present

## 2024-03-04 ENCOUNTER — Ambulatory Visit (INDEPENDENT_AMBULATORY_CARE_PROVIDER_SITE_OTHER): Admitting: Vascular Surgery

## 2024-03-04 ENCOUNTER — Other Ambulatory Visit (INDEPENDENT_AMBULATORY_CARE_PROVIDER_SITE_OTHER)

## 2024-03-04 ENCOUNTER — Ambulatory Visit (INDEPENDENT_AMBULATORY_CARE_PROVIDER_SITE_OTHER)

## 2024-03-04 ENCOUNTER — Encounter (INDEPENDENT_AMBULATORY_CARE_PROVIDER_SITE_OTHER): Payer: Self-pay | Admitting: Vascular Surgery

## 2024-03-04 VITALS — BP 136/77 | HR 57 | Resp 16 | Ht 70.0 in | Wt 208.4 lb

## 2024-03-04 DIAGNOSIS — E118 Type 2 diabetes mellitus with unspecified complications: Secondary | ICD-10-CM

## 2024-03-04 DIAGNOSIS — I10 Essential (primary) hypertension: Secondary | ICD-10-CM

## 2024-03-04 DIAGNOSIS — I70221 Atherosclerosis of native arteries of extremities with rest pain, right leg: Secondary | ICD-10-CM

## 2024-03-04 DIAGNOSIS — I890 Lymphedema, not elsewhere classified: Secondary | ICD-10-CM | POA: Diagnosis not present

## 2024-03-04 DIAGNOSIS — I70229 Atherosclerosis of native arteries of extremities with rest pain, unspecified extremity: Secondary | ICD-10-CM

## 2024-03-04 NOTE — Progress Notes (Signed)
 MRN : [id number]  Phillip Mendez is a 73 y.o. (02/27/1951) male who presents with chief complaint of  Chief Complaint  Patient presents with   Follow-up     ABI/RLE Arterial  .  History of Present Illness: Patient returns today in follow up of his peripheral arterial disease and right lower extremity lymphedema.  His neuropathic pain of the right lower extremity persist and is basically unchanged.  His biggest issue now is the severe swelling in the right leg but also seems to make the pain worse.  He has some twinges in the left leg and some dry skin but no open wounds or disabling claudication symptoms.  He did not receive the lymphedema pump that we requested 6 months ago. His ABIs today are stable and normal on the right at 1.12.  No stenosis on the right side on duplex. Left ABI has dropped a little to 0.75 with biphasic waveforms.   Current Outpatient Medications  Medication Sig Dispense Refill   acetaminophen  (TYLENOL ) 325 MG tablet Take 1-2 tablets (325-650 mg total) by mouth every 6 (six) hours as needed for mild pain (pain score 1-3 or temp > 100.5).     albuterol  (VENTOLIN  HFA) 108 (90 Base) MCG/ACT inhaler Inhale 2 puffs into the lungs every 6 (six) hours as needed for wheezing or shortness of breath. 8 g 2   aspirin  EC 81 MG tablet Take 81 mg by mouth daily. Swallow whole.     calcium  carbonate (TUMS - DOSED IN MG ELEMENTAL CALCIUM ) 500 MG chewable tablet Chew 2 tablets by mouth daily as needed for indigestion or heartburn.     famotidine  (PEPCID ) 20 MG tablet Take 20 mg by mouth daily as needed for heartburn or indigestion.     HYDROcodone -acetaminophen  (NORCO/VICODIN) 5-325 MG tablet Take 1 tablet by mouth every 8 (eight) hours as needed for severe pain (pain score 7-10). (Patient not taking: Reported on 03/04/2024) 20 tablet 0   INCRUSE ELLIPTA  62.5 MCG/ACT AEPB Inhale 1 puff into the lungs daily.     losartan -hydrochlorothiazide  (HYZAAR) 100-12.5 MG tablet Take 1 tablet by  mouth daily.     rosuvastatin  (CRESTOR ) 20 MG tablet Take 20 mg by mouth daily.     apixaban  (ELIQUIS ) 5 MG TABS tablet Take 1 tablet (5 mg total) by mouth 2 (two) times daily. (Patient not taking: Reported on 03/04/2024) 60 tablet 3   clopidogrel  (PLAVIX ) 75 MG tablet Take 1 tablet (75 mg total) by mouth daily at 6 (six) AM. (Patient not taking: Reported on 03/04/2024) 30 tablet 11   Fluticasone-Umeclidin-Vilant 100-62.5-25 MCG/ACT AEPB Inhale 1 puff into the lungs daily. (Patient not taking: Reported on 03/04/2024)     gabapentin  (NEURONTIN ) 300 MG capsule Take 1 capsule (300 mg total) by mouth at bedtime. (Patient not taking: Reported on 03/04/2024) 30 capsule 5   terbinafine (LAMISIL) 250 MG tablet Take by mouth. (Patient not taking: Reported on 03/04/2024)     No current facility-administered medications for this visit.    Past Medical History:  Diagnosis Date   Arthritis    Atherosclerosis of abdominal aorta    Atherosclerosis of native arteries of extremity with intermittent claudication    Atherosclerosis of native arteries of extremity with rest pain (HCC)    Chronic bronchitis (HCC)    Coronary artery disease    Diabetes mellitus type 2 with complications (HCC)    GERD (gastroesophageal reflux disease)    Hypertension    Post-traumatic osteoarthritis of right hip  Past Surgical History:  Procedure Laterality Date   ENDARTERECTOMY FEMORAL Bilateral 04/18/2023   Procedure: ENDARTERECTOMY FEMORAL (RIGHT SFA STENT) (POSSIBLE RIGHT FEM-POP BYPASS);  Surgeon: Marea Selinda RAMAN, MD;  Location: [facility];  Service: Vascular;  Laterality: Bilateral;   FRACTURE SURGERY Left    wrist   HERNIA REPAIR Bilateral    groin   LOWER EXTREMITY ANGIOGRAPHY Right 03/15/2023   Procedure: Lower Extremity Angiography;  Surgeon: Marea Selinda RAMAN, MD;  Location: [facility];  Service: Cardiovascular;  Laterality: Right;   MUSCLE REPAIR Right    arm and leg   TOTAL HIP ARTHROPLASTY Right 09/04/2017    Procedure: TOTAL HIP ARTHROPLASTY ANTERIOR APPROACH;  Surgeon: Kathlynn Sharper, MD;  Location: [facility];  Service: Orthopedics;  Laterality: Right;     Social History   Tobacco Use   Smoking status: Every Day    Current packs/day: 0.50    Average packs/day: 1 pack/day for 43.7 years (42.4 ttl pk-yrs)    Types: Cigarettes    Start date: 2023   Smokeless tobacco: Never  Vaping Use   Vaping status: Never Used  Substance Use Topics   Alcohol use: Yes    Alcohol/week: 18.0 standard drinks of alcohol    Types: 18 Cans of beer per week   Drug use: Never      Family History  Problem Relation Age of Onset   Cancer Brother      No Known Allergies   REVIEW OF SYSTEMS (Negative unless checked)   Constitutional: [] Weight loss  [] Fever  [] Chills Cardiac: [] Chest pain   [] Chest pressure   [] Palpitations   [] Shortness of breath when laying flat   [] Shortness of breath at rest   [] Shortness of breath with exertion. Vascular:  [x] Pain in legs with walking   [] Pain in legs at rest   [] Pain in legs when laying flat   [x] Claudication   [] Pain in feet when walking  [x] Pain in feet at rest  [] Pain in feet when laying flat   [] History of DVT   [] Phlebitis   [x] Swelling in legs   [] Varicose veins   [] Non-healing ulcers Pulmonary:   [] Uses home oxygen   [] Productive cough   [] Hemoptysis   [] Wheeze  [] COPD   [] Asthma Neurologic:  [] Dizziness  [] Blackouts   [] Seizures   [] History of stroke   [] History of TIA  [] Aphasia   [] Temporary blindness   [] Dysphagia   [] Weakness or numbness in arms   [] Weakness or numbness in legs Musculoskeletal:  [x] Arthritis   [] Joint swelling   [] Joint pain   [] Low back pain Hematologic:  [] Easy bruising  [] Easy bleeding   [] Hypercoagulable state   [] Anemic   Gastrointestinal:  [] Blood in stool   [] Vomiting blood  [x] Gastroesophageal reflux/heartburn   [] Abdominal pain Genitourinary:  [] Chronic kidney disease   [] Difficult urination  [] Frequent urination  [] Burning with  urination   [] Hematuria Skin:  [] Rashes   [] Ulcers   [] Wounds Psychological:  [] History of anxiety   []  History of major depression.  Physical Examination  BP 136/77   Pulse (!) 57   Resp 16   Ht 5' 10 (1.778 m)   Wt 208 lb 6.4 oz (94.5 kg)   BMI 29.90 kg/m  Gen:  WD/WN, NAD Head: [place]/AT, No temporalis wasting. Ear/Nose/Throat: Hearing grossly intact, nares w/o erythema or drainage Eyes: Conjunctiva clear. Sclera non-icteric Neck: Supple.  Trachea midline Pulmonary:  Good air movement, no use of accessory muscles.  Cardiac: RRR, no JVD Vascular:  Vessel Right  Left  Radial Palpable Palpable                          PT 1+ Palpable 1+ Palpable  DP 1+ Palpable 1+ Palpable   Gastrointestinal: soft, non-tender/non-distended. No guarding/reflex.  Musculoskeletal: M/S 5/5 throughout.  No deformity or atrophy. Significant hyperpigmentation and stasis changes on the right side. More mild on the left side. 2+ RLE edema. Neurologic: Sensation grossly intact in extremities.  Symmetrical.  Speech is fluent.  Psychiatric: Judgment intact, Mood & affect appropriate for pt's clinical situation. Dermatologic: No rashes or ulcers noted.  No cellulitis or open wounds.      Labs No results found for this or any previous visit (from the past 2160 hours).  Radiology No results found.  Assessment/Plan  Essential hypertension blood pressure control important in reducing the progression of atherosclerotic disease. On appropriate oral medications.   Diabetes mellitus type 2 with complications (HCC) blood glucose control important in reducing the progression of atherosclerotic disease. Also, involved in wound healing. On appropriate medications.   Atherosclerosis of native arteries of extremity with rest pain (HCC) His ABIs today are stable and normal on the right at 1.12.  No stenosis on the right side on duplex. Left ABI has dropped a little to 0.75 with biphasic waveforms. Continue  medical regimen. Recheck in 6 months.  Lymphedema Recommend:  The patient has been wearing compression for more than 12 weeks with no or little benefit.  The patient has been exercising daily for more than 12 weeks. The patient has been elevating and taking OTC pain medications for more than 12 weeks.  None of these have have eliminated the pain related to the lymphedema or the discomfort regarding excessive swelling and venous congestion.    I have reviewed my discussion with the patient regarding lymphedema and why it  causes symptoms.  Patient will continue wearing graduated compression on a daily basis. The patient should put the compression on first thing in the morning and removing them in the evening. The patient should not sleep in the compression.   In addition, behavioral modification throughout the day will be continued.  This will include frequent elevation (such as in a recliner), use of over the counter pain medications as needed and exercise such as walking.  The systemic causes for chronic edema such as liver, kidney and cardiac etiologies do not appear to have significant changed over the past year.    The patient has chronic , severe lymphedema with hyperpigmentation of the skin and has done MLD, skin care, medication, diet, exercise, elevation and compression for 4 weeks with no improvement,  I am recommending a lymphedema pump.  The patient still has stage 3 lymphedema and therefore, I believe that a lymph pump is needed to improve the control of the patient's lymphedema and improve the quality of life.  Additionally, a lymph pump is warranted because it will reduce the risk of cellulitis and ulceration in the future.  Patient should follow-up in six months     Selinda Gu, MD  03/04/2024 9:29 AM    This note was created with Dragon medical transcription system.  Any errors from dictation are purely unintentional

## 2024-03-04 NOTE — Assessment & Plan Note (Signed)
 blood pressure control important in reducing the progression of atherosclerotic disease. On appropriate oral medications.

## 2024-03-04 NOTE — Assessment & Plan Note (Signed)
 blood glucose control important in reducing the progression of atherosclerotic disease. Also, involved in wound healing. On appropriate medications.

## 2024-03-04 NOTE — Assessment & Plan Note (Signed)
 Recommend:  The patient has been wearing compression for more than 12 weeks with no or little benefit.  The patient has been exercising daily for more than 12 weeks. The patient has been elevating and taking OTC pain medications for more than 12 weeks.  None of these have have eliminated the pain related to the lymphedema or the discomfort regarding excessive swelling and venous congestion.    I have reviewed my discussion with the patient regarding lymphedema and why it  causes symptoms.  Patient will continue wearing graduated compression on a daily basis. The patient should put the compression on first thing in the morning and removing them in the evening. The patient should not sleep in the compression.   In addition, behavioral modification throughout the day will be continued.  This will include frequent elevation (such as in a recliner), use of over the counter pain medications as needed and exercise such as walking.  The systemic causes for chronic edema such as liver, kidney and cardiac etiologies do not appear to have significant changed over the past year.    The patient has chronic , severe lymphedema with hyperpigmentation of the skin and has done MLD, skin care, medication, diet, exercise, elevation and compression for 4 weeks with no improvement,  I am recommending a lymphedema pump.  The patient still has stage 3 lymphedema and therefore, I believe that a lymph pump is needed to improve the control of the patient's lymphedema and improve the quality of life.  Additionally, a lymph pump is warranted because it will reduce the risk of cellulitis and ulceration in the future.  Patient should follow-up in six months

## 2024-03-04 NOTE — Assessment & Plan Note (Signed)
 His ABIs today are stable and normal on the right at 1.12.  No stenosis on the right side on duplex. Left ABI has dropped a little to 0.75 with biphasic waveforms. Continue medical regimen. Recheck in 6 months.

## 2024-03-07 LAB — VAS US ABI WITH/WO TBI
Left ABI: 0.75
Right ABI: 1.12

## 2024-03-18 IMAGING — CT CT CHEST LUNG CANCER SCREENING LOW DOSE W/O CM
1 of 2 series · 15 of 33 positions shown, 19 images · non-contrast
Comparison: 11/29/2020

CLINICAL DATA: Current asymptomatic smoker with 45 pack-year
history.



[Series 4: chest 1mm/super d · axial · 0.73mm/px · z∈[-332,-47]mm · 15 of 392 slices shown, 19 images]
[im 18/392  mediastinal]
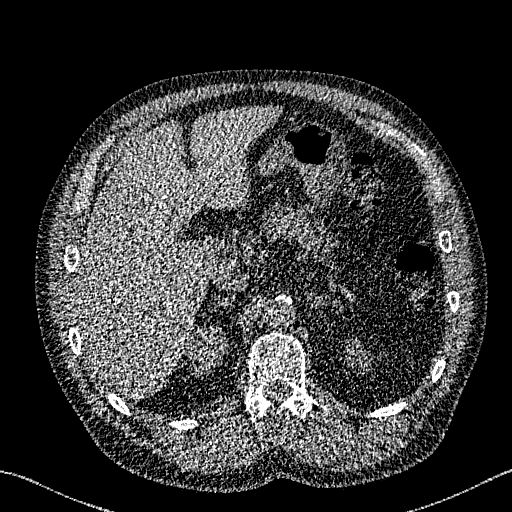
[im 18/392  lung]
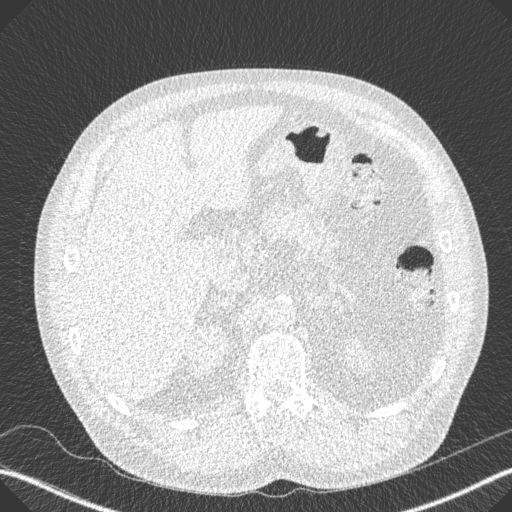
[im 52/392  lung]
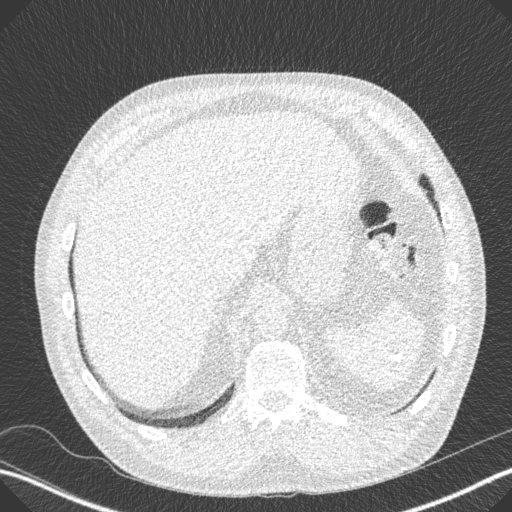
[im 86/392  lung]
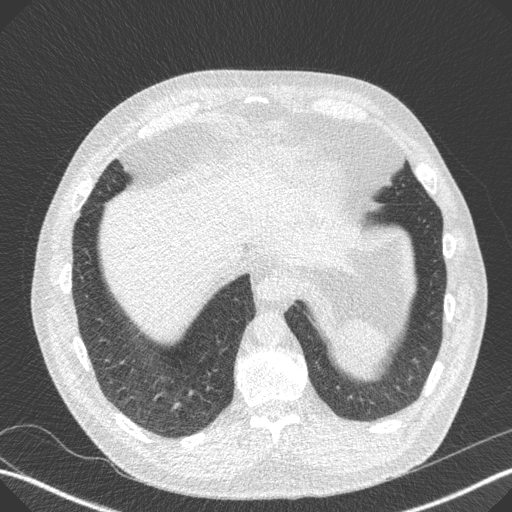
[im 103/392  lung]
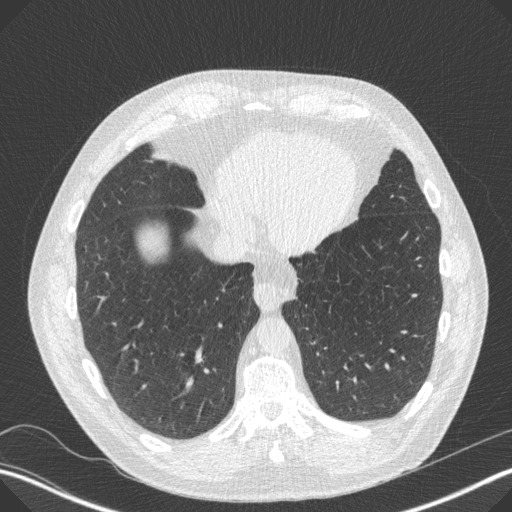
[im 120/392  mediastinal]
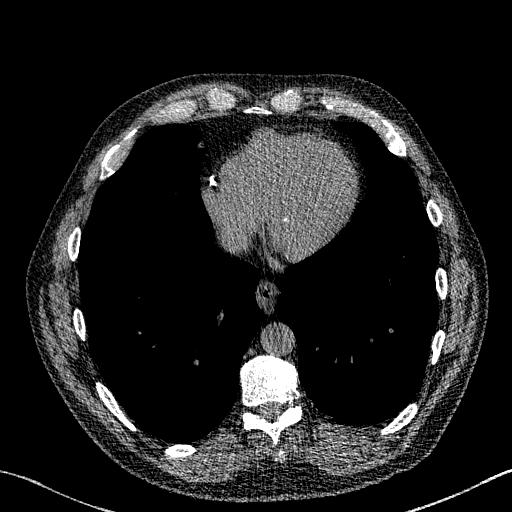
[im 120/392  lung]
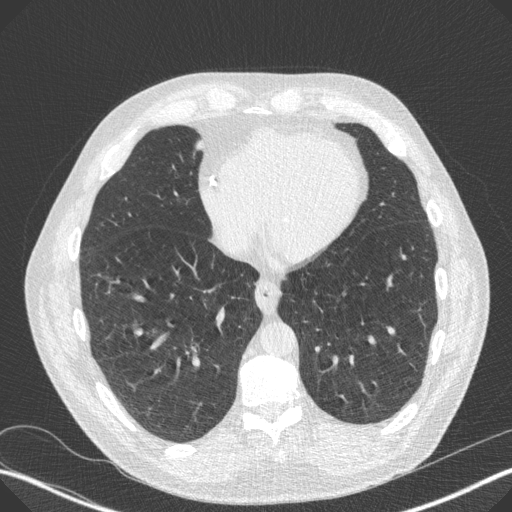
[im 154/392  lung]
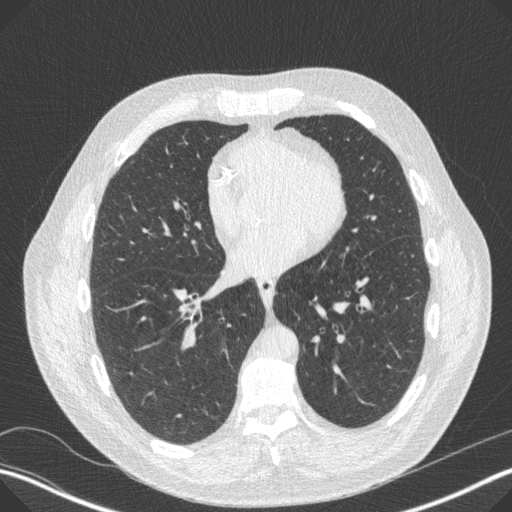
[im 184/392  lung]
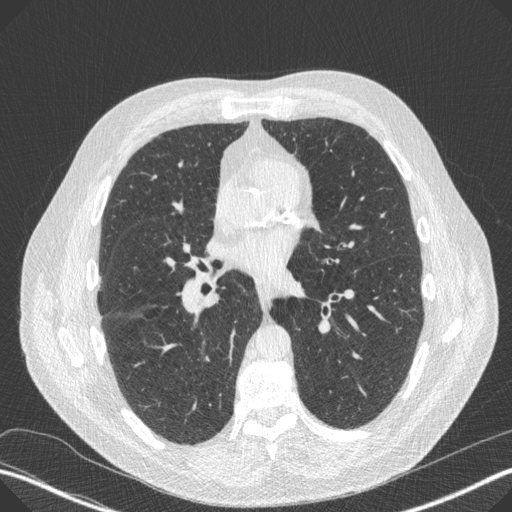
[im 196/392  lung]
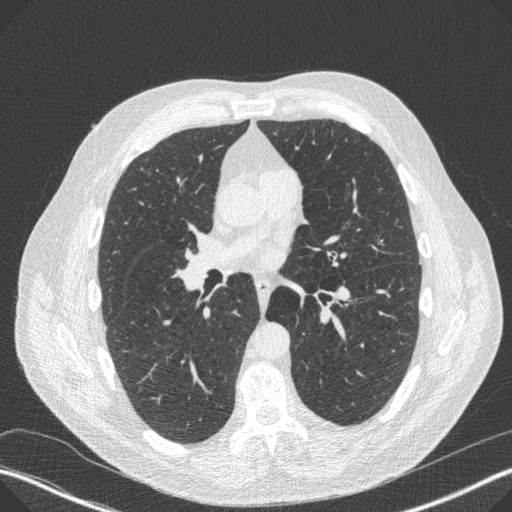
[im 205/392  mediastinal]
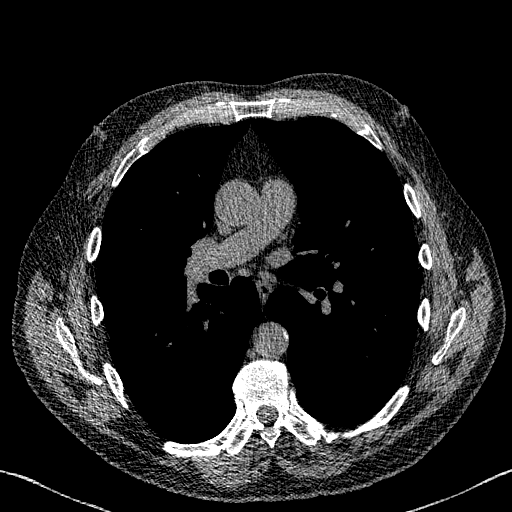
[im 205/392  lung]
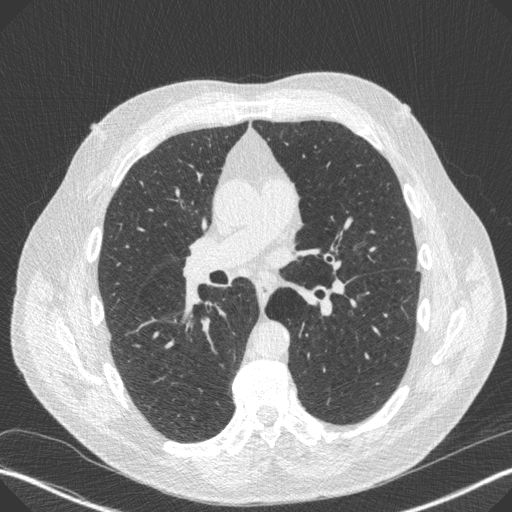
[im 239/392  lung]
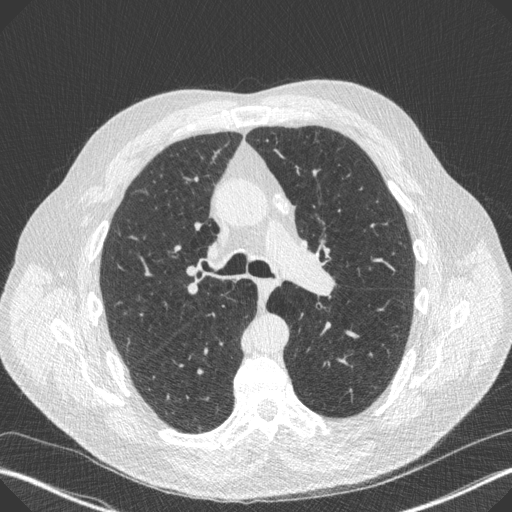
[im 273/392  lung]
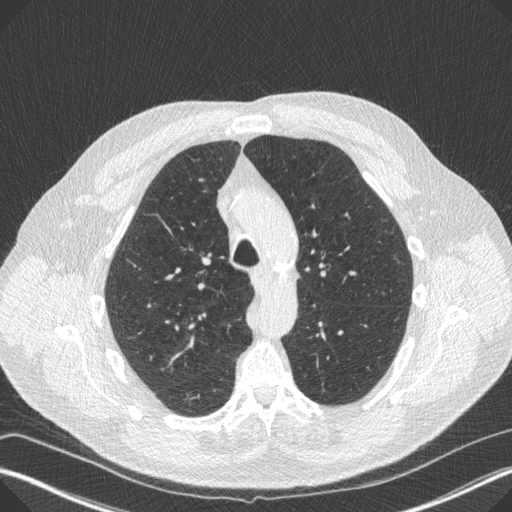
[im 290/392  lung]
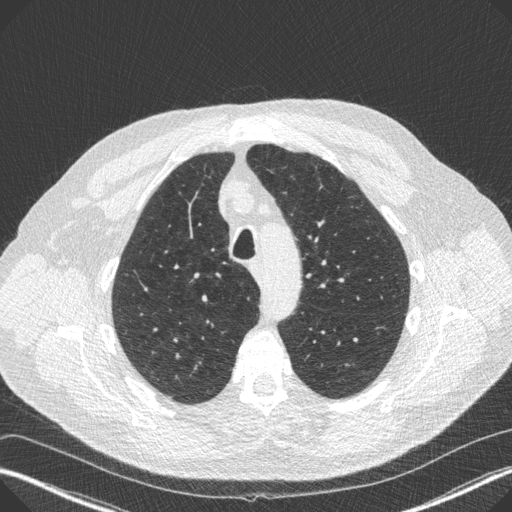
[im 307/392  mediastinal]
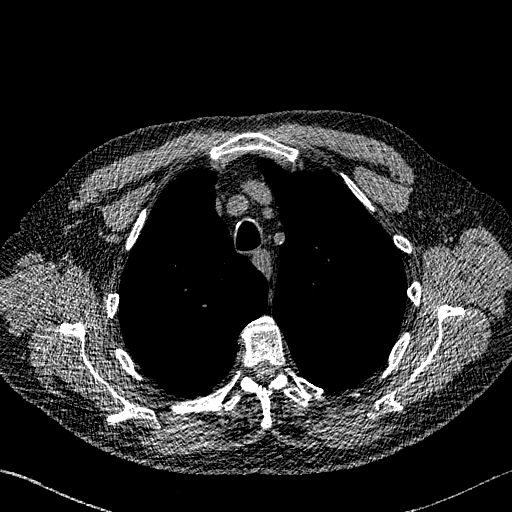
[im 307/392  lung]
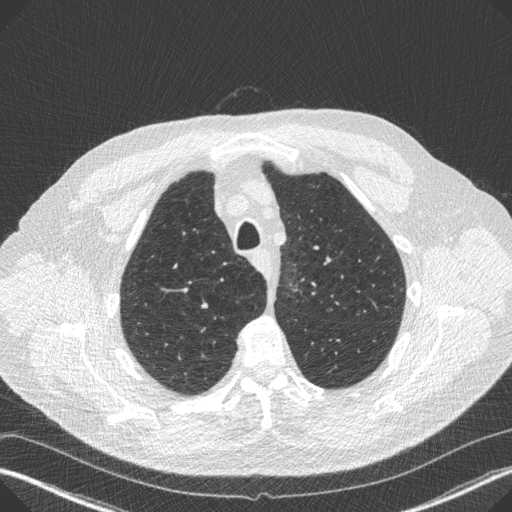
[im 341/392  lung]
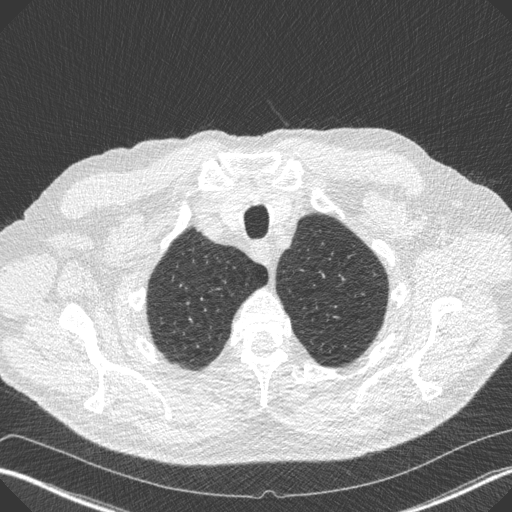
[im 375/392  lung]
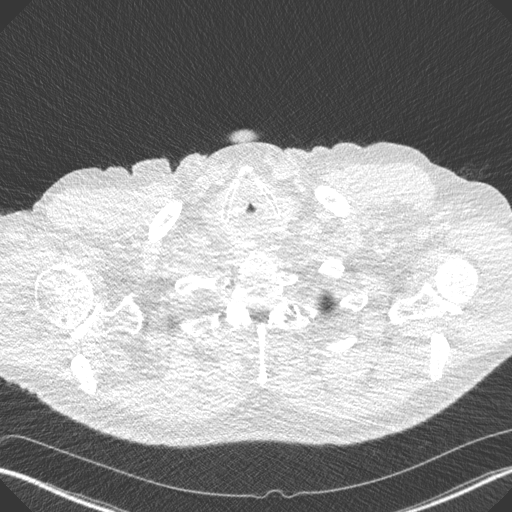

[15 of 33 positions shown; findings below may reference images not displayed]

FINDINGS: Cardiovascular: Heart size is normal. Aortic atherosclerosis and
coronary artery calcifications. No pericardial effusion.

Mediastinum/Nodes: Thyroid gland and trachea are unremarkable. There
is circumferential wall thickening involving the distal esophagus.
This is unchanged compared with previous exam and may reflect
underlying esophagitis.

No enlarged axillary, supraclavicular, mediastinal, or hilar lymph
nodes. Unchanged calcified prevascular lymph node.

Lungs/Pleura: Moderate paraseptal and centrilobular emphysema with
diffuse bronchial wall thickening. No pleural effusion or airspace
consolidation. Small calcified and noncalcified lung nodules are
unchanged from previous exam. The noncalcified lung nodules measure
up to 1.8 mm. No suspicious lung nodules identified at this time.

Upper Abdomen: No acute abnormality. Calcified granulomas identified
within the spleen.

Musculoskeletal: No chest wall mass or suspicious bone lesions
identified.
IMPRESSION: 1. Lung-RADS 2, benign appearance or behavior. Continue annual
screening with low-dose chest CT without contrast in 12 months.
2. Coronary artery calcifications
3. Aortic Atherosclerosis ([id number].N) and Emphysema ([id number]).

## 2024-09-02 ENCOUNTER — Encounter (INDEPENDENT_AMBULATORY_CARE_PROVIDER_SITE_OTHER)

## 2024-09-02 ENCOUNTER — Ambulatory Visit (INDEPENDENT_AMBULATORY_CARE_PROVIDER_SITE_OTHER): Admitting: Vascular Surgery
